# Patient Record
Sex: Male | Born: 1975
Health system: Southern US, Community
[De-identification: ages and names within clinical notes are randomized; demographics above are authoritative.]

## PROBLEM LIST (undated history)

## (undated) DIAGNOSIS — I10 Essential (primary) hypertension: Secondary | ICD-10-CM

## (undated) DIAGNOSIS — G8929 Other chronic pain: Secondary | ICD-10-CM

## (undated) DIAGNOSIS — M79606 Pain in leg, unspecified: Secondary | ICD-10-CM

## (undated) DIAGNOSIS — K029 Dental caries, unspecified: Secondary | ICD-10-CM

---

## 2001-03-09 ENCOUNTER — Emergency Department (HOSPITAL_COMMUNITY): Admission: EM | Admit: 2001-03-09 | Discharge: 2001-03-09 | Payer: Self-pay | Admitting: Emergency Medicine

## 2004-01-09 ENCOUNTER — Emergency Department (HOSPITAL_COMMUNITY): Admission: EM | Admit: 2004-01-09 | Discharge: 2004-01-09 | Payer: Self-pay | Admitting: Emergency Medicine

## 2004-02-03 ENCOUNTER — Emergency Department (HOSPITAL_COMMUNITY): Admission: EM | Admit: 2004-02-03 | Discharge: 2004-02-03 | Payer: Self-pay | Admitting: Emergency Medicine

## 2004-02-17 ENCOUNTER — Emergency Department (HOSPITAL_COMMUNITY): Admission: EM | Admit: 2004-02-17 | Discharge: 2004-02-17 | Payer: Self-pay | Admitting: Emergency Medicine

## 2004-03-19 ENCOUNTER — Emergency Department (HOSPITAL_COMMUNITY): Admission: EM | Admit: 2004-03-19 | Discharge: 2004-03-19 | Payer: Self-pay | Admitting: Emergency Medicine

## 2004-04-20 ENCOUNTER — Emergency Department (HOSPITAL_COMMUNITY): Admission: EM | Admit: 2004-04-20 | Discharge: 2004-04-20 | Payer: Self-pay | Admitting: Emergency Medicine

## 2004-05-20 ENCOUNTER — Emergency Department (HOSPITAL_COMMUNITY): Admission: EM | Admit: 2004-05-20 | Discharge: 2004-05-21 | Payer: Self-pay | Admitting: *Deleted

## 2004-06-03 ENCOUNTER — Emergency Department (HOSPITAL_COMMUNITY): Admission: EM | Admit: 2004-06-03 | Discharge: 2004-06-03 | Payer: Self-pay | Admitting: Emergency Medicine

## 2004-11-04 ENCOUNTER — Emergency Department (HOSPITAL_COMMUNITY): Admission: EM | Admit: 2004-11-04 | Discharge: 2004-11-04 | Payer: Self-pay | Admitting: Emergency Medicine

## 2004-11-30 ENCOUNTER — Emergency Department (HOSPITAL_COMMUNITY): Admission: EM | Admit: 2004-11-30 | Discharge: 2004-11-30 | Payer: Self-pay | Admitting: Emergency Medicine

## 2005-01-03 ENCOUNTER — Emergency Department (HOSPITAL_COMMUNITY): Admission: EM | Admit: 2005-01-03 | Discharge: 2005-01-03 | Payer: Self-pay | Admitting: Emergency Medicine

## 2005-01-19 ENCOUNTER — Ambulatory Visit: Payer: Self-pay | Admitting: Family Medicine

## 2005-01-20 ENCOUNTER — Emergency Department (HOSPITAL_COMMUNITY): Admission: EM | Admit: 2005-01-20 | Discharge: 2005-01-20 | Payer: Self-pay | Admitting: Emergency Medicine

## 2005-01-24 ENCOUNTER — Emergency Department (HOSPITAL_COMMUNITY): Admission: EM | Admit: 2005-01-24 | Discharge: 2005-01-24 | Payer: Self-pay | Admitting: Emergency Medicine

## 2005-05-17 ENCOUNTER — Emergency Department (HOSPITAL_COMMUNITY): Admission: EM | Admit: 2005-05-17 | Discharge: 2005-05-17 | Payer: Self-pay | Admitting: *Deleted

## 2005-06-06 ENCOUNTER — Emergency Department (HOSPITAL_COMMUNITY): Admission: EM | Admit: 2005-06-06 | Discharge: 2005-06-06 | Payer: Self-pay | Admitting: Emergency Medicine

## 2005-07-31 ENCOUNTER — Emergency Department: Payer: Self-pay | Admitting: General Practice

## 2005-08-07 ENCOUNTER — Emergency Department (HOSPITAL_COMMUNITY): Admission: EM | Admit: 2005-08-07 | Discharge: 2005-08-07 | Payer: Self-pay | Admitting: Emergency Medicine

## 2005-10-14 ENCOUNTER — Emergency Department (HOSPITAL_COMMUNITY): Admission: EM | Admit: 2005-10-14 | Discharge: 2005-10-14 | Payer: Self-pay | Admitting: Emergency Medicine

## 2005-12-16 ENCOUNTER — Emergency Department (HOSPITAL_COMMUNITY): Admission: EM | Admit: 2005-12-16 | Discharge: 2005-12-16 | Payer: Self-pay | Admitting: Emergency Medicine

## 2006-01-11 ENCOUNTER — Ambulatory Visit (HOSPITAL_COMMUNITY): Admission: RE | Admit: 2006-01-11 | Discharge: 2006-01-11 | Payer: Self-pay | Admitting: Family Medicine

## 2006-01-12 ENCOUNTER — Ambulatory Visit: Payer: Self-pay | Admitting: Family Medicine

## 2006-02-07 ENCOUNTER — Ambulatory Visit: Payer: Self-pay | Admitting: Family Medicine

## 2006-03-10 ENCOUNTER — Ambulatory Visit: Payer: Self-pay | Admitting: Family Medicine

## 2006-04-13 ENCOUNTER — Emergency Department (HOSPITAL_COMMUNITY): Admission: EM | Admit: 2006-04-13 | Discharge: 2006-04-13 | Payer: Self-pay | Admitting: Emergency Medicine

## 2006-05-15 ENCOUNTER — Ambulatory Visit: Payer: Self-pay | Admitting: Family Medicine

## 2006-11-15 ENCOUNTER — Emergency Department (HOSPITAL_COMMUNITY): Admission: EM | Admit: 2006-11-15 | Discharge: 2006-11-15 | Payer: Self-pay | Admitting: Emergency Medicine

## 2007-08-14 ENCOUNTER — Emergency Department (HOSPITAL_COMMUNITY): Admission: EM | Admit: 2007-08-14 | Discharge: 2007-08-14 | Payer: Self-pay | Admitting: Emergency Medicine

## 2007-11-22 ENCOUNTER — Encounter: Payer: Self-pay | Admitting: Family Medicine

## 2010-12-21 NOTE — Letter (Signed)
Summary: Historic Patient File  Historic Patient File   Imported By: Lind Guest 08/27/2010 16:29:29  _____________________________________________________________________  External Attachment:    Type:   Image     Comment:   External Document

## 2012-07-16 DIAGNOSIS — R079 Chest pain, unspecified: Secondary | ICD-10-CM

## 2013-08-04 ENCOUNTER — Emergency Department (HOSPITAL_COMMUNITY)
Admission: EM | Admit: 2013-08-04 | Discharge: 2013-08-04 | Disposition: A | Discharge status: Home / Self Care | Payer: Medicaid - Out of State | Attending: Emergency Medicine | Admitting: Emergency Medicine

## 2013-08-04 ENCOUNTER — Encounter (HOSPITAL_COMMUNITY): Payer: Self-pay | Admitting: *Deleted

## 2013-08-04 DIAGNOSIS — Z79899 Other long term (current) drug therapy: Secondary | ICD-10-CM | POA: Insufficient documentation

## 2013-08-04 DIAGNOSIS — K089 Disorder of teeth and supporting structures, unspecified: Secondary | ICD-10-CM | POA: Insufficient documentation

## 2013-08-04 DIAGNOSIS — F17200 Nicotine dependence, unspecified, uncomplicated: Secondary | ICD-10-CM | POA: Insufficient documentation

## 2013-08-04 DIAGNOSIS — K029 Dental caries, unspecified: Secondary | ICD-10-CM

## 2013-08-04 MED ORDER — CLINDAMYCIN HCL 150 MG PO CAPS
300.0000 mg | ORAL_CAPSULE | Freq: Once | ORAL | Status: AC
  Administered 2013-08-04: 300 mg via ORAL
  Filled 2013-08-04: qty 2

## 2013-08-04 MED ORDER — OXYCODONE-ACETAMINOPHEN 5-325 MG PO TABS
1.0000 | ORAL_TABLET | Freq: Once | ORAL | Status: AC
  Administered 2013-08-04: 1 via ORAL
  Filled 2013-08-04: qty 1

## 2013-08-04 MED ORDER — NAPROXEN 500 MG PO TABS: 500.0000 mg | ORAL_TABLET | Freq: Two times a day (BID) | ORAL | Status: DC

## 2013-08-04 MED ORDER — CLINDAMYCIN HCL 150 MG PO CAPS: 150.0000 mg | ORAL_CAPSULE | Freq: Four times a day (QID) | ORAL | Status: DC

## 2013-08-04 NOTE — ED Provider Notes (Signed)
CSN: [id number]     Arrival date & time 08/04/13  1332 History   First MD Initiated Contact with Patient 08/04/13 1352     Chief Complaint  Patient presents with  . Dental Pain   (Consider location/radiation/quality/duration/timing/severity/associated sxs/prior Treatment) Patient is a 37 y.o. male presenting with tooth pain. The history is provided by the patient.  Dental Pain Location:  Lower Lower teeth location:  19/LL 1st molar and 18/LL 2nd molar Quality:  Throbbing Severity:  Severe Onset quality:  Gradual Timing:  Constant Progression:  Worsening Chronicity:  New Context: dental caries   Worsened by:  Cold food/drink, jaw movement and pressure Ineffective treatments:  Acetaminophen and NSAIDs Associated symptoms: no congestion, no fever, no headaches, no neck pain, no oral bleeding and no oral lesions    Jeff Nash is a 37 y.o. male who presents to the ED with dental pain. He states the pain has been there for "a while" but this morning got really bad. The pain is located in the lower right dental area. The pain is severe. The pain radiates to the right ear.  History reviewed. No pertinent past medical history. History reviewed. No pertinent past surgical history. No family history on file. History  Substance Use Topics  . Smoking status: Current Every Day Smoker    Types: Cigarettes  . Smokeless tobacco: Not on file  . Alcohol Use: Yes     Comment: Occ    Review of Systems  Constitutional: Negative for fever and chills.  HENT: Positive for dental problem. Negative for congestion, mouth sores, neck pain and tinnitus.   Gastrointestinal: Negative for nausea and vomiting.  Musculoskeletal: Negative for myalgias.  Skin: Negative for rash.  Allergic/Immunologic: Negative for immunocompromised state.  Neurological: Negative for headaches.  Psychiatric/Behavioral: The patient is not nervous/anxious.     Allergies  Review of patient's allergies indicates no known  allergies.  Home Medications   Current Outpatient Rx  Name  Route  Sig  Dispense  Refill  . clindamycin (CLEOCIN) 150 MG capsule   Oral   Take 1 capsule (150 mg total) by mouth every 6 (six) hours.   28 capsule   0   . naproxen (NAPROSYN) 500 MG tablet   Oral   Take 1 tablet (500 mg total) by mouth 2 (two) times daily.   20 tablet   0    BP 157/96  Pulse 95  Temp(Src) 98 F (36.7 C) (Oral)  Resp 16  SpO2 100% Physical Exam  Nursing note and vitals reviewed. Constitutional: He is oriented to person, place, and time. He appears well-developed and well-nourished. No distress.  HENT:  Head: Normocephalic.  Right Ear: Tympanic membrane normal.  Left Ear: Tympanic membrane normal.  Nose: Nose normal.  Mouth/Throat: Uvula is midline, oropharynx is clear and moist and mucous membranes are normal. Dental caries present.    Pain and caries right lower first and second molars.  Eyes: EOM are normal.  Neck: Neck supple.  Cardiovascular: Normal rate and regular rhythm.   Pulmonary/Chest: Effort normal and breath sounds normal.  Abdominal: Soft. There is no tenderness.  Musculoskeletal: Normal range of motion.  Neurological: He is alert and oriented to person, place, and time. No cranial nerve deficit.  Skin: Skin is warm and dry.  Psychiatric: He has a normal mood and affect. His behavior is normal.    ED Course  Procedures  MDM   1. Pain due to dental caries    37 y.o. male  with dental pain due to caries. Will treat with antibiotics and NSAIDS. He is to keep his follow up appointment with the dentist.  Discussed with the patient and all questioned fully answered.   Medication List         clindamycin 150 MG capsule  Commonly known as:  CLEOCIN  Take 1 capsule (150 mg total) by mouth every 6 (six) hours.     naproxen 500 MG tablet  Commonly known as:  NAPROSYN  Take 1 tablet (500 mg total) by mouth 2 (two) times daily.           Janne Napoleon, [place] 08/04/13  1415

## 2013-08-04 NOTE — ED Provider Notes (Signed)
Medical screening examination/treatment/procedure(s) were performed by non-physician practitioner and as supervising physician I was immediately available for consultation/collaboration.  Devyon Keator, MD 08/04/13 1520 

## 2013-08-04 NOTE — ED Notes (Signed)
Dental pain since last night. Pain to right bottom molar, per pt. Appt to see dentist in 2 mo.

## 2013-08-21 ENCOUNTER — Encounter (HOSPITAL_COMMUNITY): Payer: Self-pay

## 2013-08-21 ENCOUNTER — Emergency Department (HOSPITAL_COMMUNITY)
Admission: EM | Admit: 2013-08-21 | Discharge: 2013-08-21 | Disposition: A | Discharge status: Home / Self Care | Payer: Medicaid - Out of State | Attending: Emergency Medicine | Admitting: Emergency Medicine

## 2013-08-21 DIAGNOSIS — M545 Low back pain, unspecified: Secondary | ICD-10-CM | POA: Insufficient documentation

## 2013-08-21 DIAGNOSIS — Z87828 Personal history of other (healed) physical injury and trauma: Secondary | ICD-10-CM | POA: Insufficient documentation

## 2013-08-21 DIAGNOSIS — F17200 Nicotine dependence, unspecified, uncomplicated: Secondary | ICD-10-CM | POA: Insufficient documentation

## 2013-08-21 DIAGNOSIS — R0789 Other chest pain: Secondary | ICD-10-CM | POA: Insufficient documentation

## 2013-08-21 MED ORDER — IBUPROFEN 600 MG PO TABS: 600.0000 mg | ORAL_TABLET | Freq: Three times a day (TID) | ORAL | Status: DC | PRN

## 2013-08-21 MED ORDER — KETOROLAC TROMETHAMINE 60 MG/2ML IM SOLN
60.0000 mg | Freq: Once | INTRAMUSCULAR | Status: AC
  Administered 2013-08-21: 60 mg via INTRAMUSCULAR
  Filled 2013-08-21: qty 2

## 2013-08-21 NOTE — ED Notes (Signed)
1. Pt reports back pain since [place] [employer]. 2. Chest "tight" since last night", sob at times, no fever, no nausea or vomiting, no cough or congestion. Was outside smoking when called for triage.

## 2013-08-21 NOTE — ED Provider Notes (Signed)
CSN: [id number]     Arrival date & time 08/21/13  1252 History   First MD Initiated Contact with Patient 08/21/13 1350     Chief Complaint  Patient presents with  . Back Pain  . Chest Pain    HPI Patient reports ongoing right lower back pain since a fall last week.  He's been seen in outside hospital and had imaging of his right hip in his L-spine done.  He states the demonstrated no evidence of fracture.  He's been home on pain medication he reports that he still is having pain at this time.  He denies weakness of his arms or legs.  Head injury.  No neck pain.  He denies history of IV drug abuse.  No fevers or chills.  No abdominal pain.  He does report some occasional chest tightness that last for a few seconds and resolves.  He has no chest pain at this time.  No shortness of breath at this time.  He denies a history of cardiac disease.  No family history of early cardiac disease.  He denies cough or congestion or upper respiratory symptoms.  No rash.   History reviewed. No pertinent past medical history. History reviewed. No pertinent past surgical history. No family history on file. History  Substance Use Topics  . Smoking status: Current Every Day Smoker    Types: Cigarettes  . Smokeless tobacco: Not on file  . Alcohol Use: Yes     Comment: Occ    Review of Systems  All other systems reviewed and are negative.    Allergies  Review of patient's allergies indicates no known allergies.  Home Medications   Current Outpatient Rx  Name  Route  Sig  Dispense  Refill  . ibuprofen (ADVIL,MOTRIN) 600 MG tablet   Oral   Take 1 tablet (600 mg total) by mouth every 8 (eight) hours as needed for pain.   15 tablet   0    BP 122/78  Pulse 81  Temp(Src) 97.9 F (36.6 C) (Oral)  Resp 20  Ht 5\' 9"  (1.753 m)  Wt 170 lb (77.111 kg)  BMI 25.09 kg/m2  SpO2 100% Physical Exam  Nursing note and vitals reviewed. Constitutional: He is oriented to person, place, and time. He appears  well-developed and well-nourished.  HENT:  Head: Normocephalic and atraumatic.  Eyes: EOM are normal.  Neck: Normal range of motion.  Cardiovascular: Normal rate, regular rhythm, normal heart sounds and intact distal pulses.   Pulmonary/Chest: Effort normal and breath sounds normal. No respiratory distress.  Abdominal: Soft. He exhibits no distension. There is no tenderness.  Musculoskeletal: Normal range of motion.  Neurological: He is alert and oriented to person, place, and time.  Skin: Skin is warm and dry.  Psychiatric: He has a normal mood and affect. Judgment normal.    ED Course  Procedures (including critical care time) Labs Review  ECG interpretation   Date: 08/21/2013  Rate: 76  Rhythm: normal sinus rhythm  QRS Axis: normal  Intervals: normal  ST/T Wave abnormalities: normal  Conduction Disutrbances: none  Narrative Interpretation:   Old EKG Reviewed: No prior KG available     Labs Reviewed - No data to display Imaging Review No results found.  MDM   1. Low back pain    Normal lower extremity neurologic exam. No bowel or bladder complaints. No back pain red flags. Likely musculoskeletal back pain. Doubt spinal epidural abscess. Doubt cauda equina. Doubt abdominal aortic aneurysm.  Chest  pain sounds musculoskeletal versus GERD.  This is not ACS     Lyanne Co, MD 08/21/13 [phone]

## 2013-11-04 ENCOUNTER — Encounter (HOSPITAL_COMMUNITY): Payer: Self-pay | Admitting: Emergency Medicine

## 2013-11-04 ENCOUNTER — Emergency Department (HOSPITAL_COMMUNITY): Payer: Medicaid - Out of State

## 2013-11-04 ENCOUNTER — Emergency Department (HOSPITAL_COMMUNITY)
Admission: EM | Admit: 2013-11-04 | Discharge: 2013-11-04 | Disposition: A | Discharge status: Home / Self Care | Payer: Medicaid - Out of State | Attending: Emergency Medicine | Admitting: Emergency Medicine

## 2013-11-04 DIAGNOSIS — Z79899 Other long term (current) drug therapy: Secondary | ICD-10-CM | POA: Insufficient documentation

## 2013-11-04 DIAGNOSIS — I10 Essential (primary) hypertension: Secondary | ICD-10-CM | POA: Insufficient documentation

## 2013-11-04 DIAGNOSIS — R197 Diarrhea, unspecified: Secondary | ICD-10-CM | POA: Insufficient documentation

## 2013-11-04 DIAGNOSIS — F17200 Nicotine dependence, unspecified, uncomplicated: Secondary | ICD-10-CM | POA: Insufficient documentation

## 2013-11-04 DIAGNOSIS — J209 Acute bronchitis, unspecified: Secondary | ICD-10-CM

## 2013-11-04 HISTORY — DX: Essential (primary) hypertension: I10

## 2013-11-04 MED ORDER — BENZONATATE 100 MG PO CAPS: 100.0000 mg | ORAL_CAPSULE | Freq: Three times a day (TID) | ORAL | Status: DC

## 2013-11-04 MED ORDER — PROMETHAZINE-CODEINE 6.25-10 MG/5ML PO SYRP: 5.0000 mL | ORAL_SOLUTION | ORAL | Status: DC | PRN

## 2013-11-04 MED ORDER — PROMETHAZINE-CODEINE 6.25-10 MG/5ML PO SYRP
5.0000 mL | ORAL_SOLUTION | Freq: Once | ORAL | Status: AC
  Administered 2013-11-04: 5 mL via ORAL
  Filled 2013-11-04: qty 5

## 2013-11-04 MED ORDER — ALBUTEROL SULFATE HFA 108 (90 BASE) MCG/ACT IN AERS: 1.0000 | INHALATION_SPRAY | Freq: Four times a day (QID) | RESPIRATORY_TRACT | Status: AC | PRN

## 2013-11-04 MED ORDER — BENZONATATE 100 MG PO CAPS
200.0000 mg | ORAL_CAPSULE | Freq: Once | ORAL | Status: AC
  Administered 2013-11-04: 200 mg via ORAL
  Filled 2013-11-04: qty 2

## 2013-11-04 MED ORDER — DIPHENOXYLATE-ATROPINE 2.5-0.025 MG PO TABS
2.0000 | ORAL_TABLET | Freq: Once | ORAL | Status: AC
  Administered 2013-11-04: 2 via ORAL
  Filled 2013-11-04: qty 2

## 2013-11-04 NOTE — ED Notes (Signed)
Pt with nonproductive cough for 1 1/2 weeks per pt, + diarrhea, denies N/V,  admits to taking mucinex without relief

## 2013-11-04 NOTE — ED Provider Notes (Signed)
CSN: [id number]     Arrival date & time 11/04/13  1053 History   First MD Initiated Contact with Patient 11/04/13 1157     Chief Complaint  Patient presents with  . Cough   (Consider location/radiation/quality/duration/timing/severity/associated sxs/prior Treatment) HPI Comments: Jeff Nash is a 37 y.o. Male presenting with a 1.5 week history of intermittent episodes of cough associated with white sputum production and occasional episodes of small amount of fecal incontinence with coughing spells.  He reports diarrhea which has been brown, soft without being watery occuring 1-2 times daily for the past several days.  He denies nausea, vomiting, abdominal pain, but has had some mild lower abdominal cramping prior to bms.  He has had no bloody stools.  Also denies shortness of breath, chest pain, fevers, nasal congestion and sore throat.  He is a daily smoker 1ppd.   The history is provided by the patient and the spouse.    Past Medical History  Diagnosis Date  . Hypertension    History reviewed. No pertinent past surgical history. History reviewed. No pertinent family history. History  Substance Use Topics  . Smoking status: Current Every Day Smoker    Types: Cigarettes  . Smokeless tobacco: Not on file  . Alcohol Use: Yes     Comment: Occ    Review of Systems  Constitutional: Negative for fever and chills.  HENT: Negative for congestion and sore throat.   Eyes: Negative.   Respiratory: Positive for cough. Negative for chest tightness and shortness of breath.   Cardiovascular: Negative for chest pain and palpitations.  Gastrointestinal: Positive for diarrhea. Negative for nausea, vomiting, abdominal pain and blood in stool.  Genitourinary: Negative.   Musculoskeletal: Negative for arthralgias, joint swelling and neck pain.  Skin: Negative.  Negative for rash and wound.  Neurological: Negative for dizziness, weakness, light-headedness, numbness and headaches.   Psychiatric/Behavioral: Negative.     Allergies  Review of patient's allergies indicates no known allergies.  Home Medications   Current Outpatient Rx  Name  Route  Sig  Dispense  Refill  . gabapentin (NEURONTIN) 600 MG tablet   Oral   Take 600 mg by mouth 3 (three) times daily.         Marland Kitchen lisinopril (PRINIVIL,ZESTRIL) 10 MG tablet   Oral   Take 10 mg by mouth daily.         Marland Kitchen albuterol (PROVENTIL HFA;VENTOLIN HFA) 108 (90 BASE) MCG/ACT inhaler   Inhalation   Inhale 1-2 puffs into the lungs every 6 (six) hours as needed for wheezing or shortness of breath.   1 Inhaler   0   . benzonatate (TESSALON) 100 MG capsule   Oral   Take 1 capsule (100 mg total) by mouth every 8 (eight) hours.   21 capsule   0   . promethazine-codeine (PHENERGAN WITH CODEINE) 6.25-10 MG/5ML syrup   Oral   Take 5 mLs by mouth every 4 (four) hours as needed for cough.   120 mL   0    BP 157/96  Pulse 85  Temp(Src) 98 F (36.7 C) (Oral)  Resp 24  Ht 5\' 9"  (1.753 m)  Wt 170 lb (77.111 kg)  BMI 25.09 kg/m2  SpO2 98% Physical Exam  Nursing note and vitals reviewed. Constitutional: He appears well-developed and well-nourished.  HENT:  Head: Normocephalic and atraumatic.  Eyes: Conjunctivae are normal.  Neck: Normal range of motion.  Cardiovascular: Normal rate, regular rhythm, normal heart sounds and intact distal pulses.  Pulmonary/Chest: Effort normal. He has no decreased breath sounds. He has wheezes. He has no rhonchi. He has no rales.  Occasional scattered expiratory wheeze, prolonged expiration phase.   Abdominal: Soft. Bowel sounds are normal. There is no tenderness.  Musculoskeletal: Normal range of motion.  Neurological: He is alert.  Skin: Skin is warm and dry.  Psychiatric: He has a normal mood and affect.    ED Course  Procedures (including critical care time) Labs Review Labs Reviewed - No data to display Imaging Review Dg Chest 2 View  11/04/2013   CLINICAL  DATA:  Cough, diarrhea  EXAM: CHEST  2 VIEW  COMPARISON:  07/16/2012  FINDINGS: Cardiomediastinal silhouette is stable. No acute infiltrate or pleural effusion. No pulmonary edema. Bony thorax is unremarkable.  IMPRESSION: No active cardiopulmonary disease.   Electronically Signed   By: Natasha Mead M.D.   On: 11/04/2013 11:57    EKG Interpretation   None       MDM   1. Bronchitis, acute, with bronchospasm    Pt prescribed albuterol, tessalon and phenegan with codeine,  Encouraged rest, fluids,  Smoking cessation.  He was given a dose of lomotil to firm stools, encouraged recheck by pcp if sx not improving over the next several days.    Discussed xray results with with patient. Patients labs and/or radiological studies were viewed and considered during the medical decision making and disposition process.     Burgess Amor, PA-C 11/06/13 1236  Burgess Amor, PA-C 11/06/13 1236

## 2013-11-08 NOTE — ED Provider Notes (Signed)
Medical screening examination/treatment/procedure(s) were performed by non-physician practitioner and as supervising physician I was immediately available for consultation/collaboration.  EKG Interpretation   None        Evens Meno, MD 11/08/13 1826 

## 2014-01-12 ENCOUNTER — Emergency Department (HOSPITAL_COMMUNITY)
Admission: EM | Admit: 2014-01-12 | Discharge: 2014-01-12 | Disposition: A | Discharge status: Home / Self Care | Payer: Medicaid - Out of State | Attending: Emergency Medicine | Admitting: Emergency Medicine

## 2014-01-12 ENCOUNTER — Encounter (HOSPITAL_COMMUNITY): Payer: Self-pay | Admitting: Emergency Medicine

## 2014-01-12 DIAGNOSIS — K0889 Other specified disorders of teeth and supporting structures: Secondary | ICD-10-CM

## 2014-01-12 DIAGNOSIS — Z79899 Other long term (current) drug therapy: Secondary | ICD-10-CM | POA: Insufficient documentation

## 2014-01-12 DIAGNOSIS — F17200 Nicotine dependence, unspecified, uncomplicated: Secondary | ICD-10-CM | POA: Insufficient documentation

## 2014-01-12 DIAGNOSIS — K0381 Cracked tooth: Secondary | ICD-10-CM | POA: Insufficient documentation

## 2014-01-12 DIAGNOSIS — K089 Disorder of teeth and supporting structures, unspecified: Secondary | ICD-10-CM | POA: Insufficient documentation

## 2014-01-12 DIAGNOSIS — I10 Essential (primary) hypertension: Secondary | ICD-10-CM | POA: Insufficient documentation

## 2014-01-12 DIAGNOSIS — K029 Dental caries, unspecified: Secondary | ICD-10-CM | POA: Insufficient documentation

## 2014-01-12 MED ORDER — TRAMADOL HCL 50 MG PO TABS: 50.0000 mg | ORAL_TABLET | Freq: Four times a day (QID) | ORAL | Status: DC | PRN

## 2014-01-12 MED ORDER — AMOXICILLIN 500 MG PO CAPS: 500.0000 mg | ORAL_CAPSULE | Freq: Three times a day (TID) | ORAL | Status: DC

## 2014-01-12 NOTE — Discharge Instructions (Signed)
Dental Pain °Toothache is pain in or around a tooth. It may get worse with chewing or with cold or heat.  °HOME CARE °· Your dentist may use a numbing medicine during treatment. If so, you may need to avoid eating until the medicine wears off. Ask your dentist about this. °· Only take medicine as told by your dentist or doctor. °· Avoid chewing food near the painful tooth until after all treatment is done. Ask your dentist about this. °GET HELP RIGHT AWAY IF:  °· The problem gets worse or new problems appear. °· You have a fever. °· There is redness and puffiness (swelling) of the face, jaw, or neck. °· You cannot open your mouth. °· There is pain in the jaw. °· There is very bad pain that is not helped by medicine. °MAKE SURE YOU:  °· Understand these instructions. °· Will watch your condition. °· Will get help right away if you are not doing well or get worse. °Document Released: 04/25/2008 Document Revised: 01/30/2012 Document Reviewed: 04/25/2008 °ExitCare® Patient Information ©2014 [employer]. ° ° °Emergency Department Resource Guide °1) Find a Doctor and Pay Out of Pocket °Although you won't have to find out who is covered by your insurance plan, it is a good idea to ask around and get recommendations. You will then need to call the office and see if the doctor you have chosen will accept you as a new patient and what types of options they offer for patients who are self-pay. Some doctors offer discounts or will set up payment plans for their patients who do not have insurance, but you will need to ask so you aren't surprised when you get to your appointment. ° °2) Contact Your Local Health Department °Not all health departments have doctors that can see patients for sick visits, but many do, so it is worth a call to see if yours does. If you don't know where your local health department is, you can check in your phone book. The CDC also has a tool to help you locate your state's health department, and many  state websites also have listings of all of their local health departments. ° °3) Find a Walk-in Clinic °If your illness is not likely to be very severe or complicated, you may want to try a walk in clinic. These are popping up all over the country in pharmacies, drugstores, and shopping centers. They're usually staffed by nurse practitioners or physician assistants that have been trained to treat common illnesses and complaints. They're usually fairly quick and inexpensive. However, if you have serious medical issues or chronic medical problems, these are probably not your best option. ° °No Primary Care Doctor: °- Call [facility] at  832-8000 - they can help you locate a primary care doctor that  accepts your insurance, provides certain services, etc. °- Physician Referral Service- 1-800-533-3463 ° °Chronic Pain Problems: °Organization         Address  Phone   Notes  °[facility]  (336) 297-2271 Patients need to be referred by their primary care doctor.  ° °Medication Assistance: °Organization         Address  Phone   Notes  °[place] [facility] [address] °[place], [place] [address] (336) 641-8030 --Must be a resident of [place] °-- Must have NO insurance coverage whatsoever (no Medicaid/ Medicare, etc.) °-- The pt. MUST have a primary care doctor that directs their care regularly and follows   them in the community °  °[facility]  (866) 331-1348   °[employer]  (888) 892-1162   ° °Agencies that provide inexpensive medical care: °Organization         Address  Phone   Notes  °[facility]  (336) 832-8035   °[facility]    (336) 832-7272   °[facility] [address] °[place], [place] [address] (336) 832-4777   °[facility] [address], °[place] (336) 271-4999   °[facility]    (336) 373-0678   °[facility]    (336) 272-1050   °[facility] ° [address], [place] Phone:  (336) 832-4444, Fax:  (336) 832-4440 Hours of Operation:  9 am - 6 pm, M-F.  Also accepts Medicaid/Medicare and self-pay.  °[facility] ° [address], [place] Phone: (336) 832-3150, Fax: (336) 832-3151. Hours of Operation:  8:30 am - 5:30 pm, M-F.  Also accepts Medicaid and self-pay.  °[facility] [place] [address], [place] Phone: (336) 878-6027   °[facility] [address], [place], [place] (336)723-1848, Ext. 123 Mondays & Thursdays: 7-9 AM.  First 15 patients are seen on a first come, first serve basis. °  ° °Medicaid-accepting [place] Providers: ° °Organization         Address  Phone   Notes  °[facility] [address], [place] (336) 641-2100 Also accepts self-pay patients.  °[facility] [address], [place] ° (336) 856-9996   °[facility] [address], [place] (336) 288-8857   °[facility] [address] [place] [address], [place] (336) 299-7000   °Veita Bland [address], [place]  ° (336) 373-1557 Only accepts [place] Access Medicaid patients after they have their name applied to their card.  ° °Self-Pay (no insurance) in [place]: ° °Organization         Address  Phone   Notes  °Sickle Cell Patients, [facility] [address], [place] (336) 832-1970   °[facility] [address], [place] (336) 832-4400   °[facility] ° [address], [place] (336) 992-4800   °[facility]/Dr. Osei-Bonsu ° [address] [place] [address], [place] or [address], [place] (336) 841-8500 Phone number for both [place] and [place] locations is the same.  °[facility] [address], [place] (336) 299-0000   °[facility] [address]  [place] [address], [place] or [address] (336) 852-7530 °(336) 878-2260   °[facility] [address], [place] (336) 350-1642, phone; (336) 294-5005, fax Sees patients 1st and 3rd Saturday of every month.  Must not qualify for public or private insurance (i.e. Medicaid, Medicare, [place] Health Choice, Veterans' Benefits) • Household income should be no more than 200% of the poverty level •The clinic cannot treat you if you are pregnant or think you are pregnant • Sexually transmitted diseases are not treated at the clinic.  ° ° °Dental Care: °Organization         Address  Phone  Notes  °[facility] [address],  (336) 641-6152 Accepts children up to age 21 who are enrolled   in Medicaid or [place] Health Choice; pregnant women with a Medicaid card; and children who have applied for Medicaid or [place] Health Choice, but were declined, whose parents can pay a reduced fee at time of service.  °[facility] [place]  [address], [place] (336) 641-7733 Accepts children up to age 21 who are enrolled in Medicaid or [place] Health Choice; pregnant women with a Medicaid card; and children who have applied for Medicaid or [place] Health Choice, but were declined, whose parents can pay a reduced fee at time of service.  °[facility] ° [address], [place] (336) 641-4533 Patients are seen by appointment only. Walk-ins are not accepted. [facility] will see patients 18 years of age and older. °Monday - Tuesday (8am-5pm) °Most Wednesdays (8:30-5pm) °$30 per visit, cash only  °[facility] ° [address], [place] (336) 641-4533 Patients are seen by appointment only. Walk-ins are not accepted. [facility] will see patients 18 years of age and older. °One Wednesday Evening (Monthly: Volunteer Based).  $30 per visit, cash only  °[facility]  (919) 537-3737 for adults; Children under age 4, call Graduate Pediatric Dentistry at (919) [phone]. Children aged 4-14, please call (919) [phone] to request a pediatric application. ° Dental services are provided in all areas of dental care including fillings, crowns and bridges, complete and partial dentures, implants, gum treatment, root canals, and extractions. Preventive care is also provided. Treatment is provided to both adults and children. °Patients are selected via a lottery and there is often a waiting list. °  °[facility] [address], °[place] ° (336) 763-8833 [email or web address] °  °[facility] [address], [place], [place] (336)723-1848, Ext. 123 Second and Fourth Thursday of each month, opens at 6:30 AM; Clinic ends at 9 AM.  Patients are seen on a first-come first-served basis, and a limited number are seen during each clinic.  ° °[facility] ° [address], [place], [place] (336) 723-7904   Eligibility Requirements °You must have lived in [place], [place], or [place] counties for at least the last three months. °  You cannot be eligible for state or federal sponsored healthcare insurance, including Veterans Administration, Medicaid, or Medicare. °  You generally cannot be eligible for healthcare insurance through your employer.  °  How to apply: °Eligibility screenings are held every Tuesday and Wednesday afternoon from 1:00 pm until 4:00 pm. You do not need an appointment for the interview!  °[facility] [address], [place], [place] 336-631-2330   °[facility]  336-342-8273   °[facility]  336-703-3100   °[facility]  336-570-6415   ° °Behavioral Health Resources in the Community: °Intensive Outpatient Programs °Organization         Address  Phone  Notes  °[facility] [address], [place], [place]  336-878-6098   °[facility] [address], [place], [place] 336-832-9800   °[facility] [address], [place], [place] ° 336-882-2125   °[facility] [address],  °[place], [place] 1-800-853-5163 or 336-641-4981   °Substance Abuse Resources °Organization         Address  Phone  Notes  °[facility]  336-882-2125   °[facility]  336-784-9470   °  [facility]  336-285-9073   °[facility]  336-845-3988   °Residential & Outpatient Substance Abuse Program  1-800-659-3381   °Psychological Services °Organization         Address  Phone  Notes  °[facility]  336- 832-9600   °[facility]  336- 378-7881   °[facility] [address], [place] 1-800-853-5163 or 336-641-4981   ° °Mobile Crisis Teams °Organization         Address  Phone  Notes  °[facility], Mobile Crisis Care Unit  1-877-626-1772   °Assertive °[facility] ° [address] [place], [place] 336-834-9664   °Sharon DeEsch [address] °[place] 336-554-5454   ° °Self-Help/Support Groups °Organization         Address  Phone             Notes  °[facility] - variety of support groups  336- 373-1402 Call for more information  °Narcotics Anonymous (NA), [facility] [address], °[place]  2 meetings at this location  ° °Residential Treatment Programs °Organization         Address  Phone  Notes  °[facility] [address],    °[place]  1-866-801-8205   °[facility] ° [address], [place], [place] 704-293-8524   °[facility] [address], [place] 336-845-3988 Admissions: 8am-3pm M-F  °[facility] [address]    °[place], [place] 336-841-1104   °[facility] [address], [place], [place] 336-379-7146   °[facility] [address]    °[place], [place] 336-285-9073   °[facility] Intensive Outpatient [address], [place], [place] 336-852-3033   °[facility] [address]  °[place], [place] 1-877-615-2722 or 336-784-9470   °[facility] [address] [place], [place] 336-227-7417 Accepts Medicaid  °[facility] [address]  °[place] 1-800-659-3381 Substance Abuse/Addiction Treatment  ° °[place] Behavioral Health Resources °Organization         Address  Phone  Notes  °[facility]  (888) 581-9988   °Julie Brannon, PhD [address] [place], [place]   (336) 349-5553 or (336) 951-0000   °[facility]   [address] °[place], [place] (336) 349-4454   °[facility] [address], [place], [place] (336) 342-8316 Insurance/Medicaid/sponsorship through [facility]  °[facility] [address]                                    [place], [place] (336) 342-8316 Therapy/tele-psych/case  °[facility] [address]  ° [place], [place] (336) 349-2233    °Dr. Arfeen  (336) 349-4544   °[facility] [place]  [employer] [place] [facility] 1) [address], [place] °2) [address], [place] °3)  [address], [place] (336) 349-3220 °(336) 342-7768 ° °(336) 342-8140   °[place] [facility] (336) 342-1394 or (336) 342-3537 (After Hours)    ° ° ° °

## 2014-01-12 NOTE — ED Notes (Signed)
Pt reports broke a tooth last night, c/o pain.

## 2014-01-12 NOTE — ED Provider Notes (Signed)
CSN: [id number]     Arrival date & time 01/12/14  1134 History   This chart was scribed for Pauline Aus, PA-C by Ladona Ridgel Day, ED scribe. This patient was seen in room APFT23/APFT23 and the patient's care was started at 1134.  Chief Complaint  Patient presents with  . Dental Pain   The history is provided by the patient. No language interpreter was used.   HPI Comments: Jeff Nash is a 38 y.o. male who presents to the Emergency Department complaining of constant, moderate left lower tooth pain, onset last PM which he attributes to fractured tooth that occurred while eating. He tried taking gabapentin and tramadol for this problem w/minimal relief from pain. Nothing makes the pain better or worse.  He does not have a dentist.   He has no allergies to medicines  Past Medical History  Diagnosis Date  . Hypertension    History reviewed. No pertinent past surgical history. No family history on file. History  Substance Use Topics  . Smoking status: Current Every Day Smoker    Types: Cigarettes  . Smokeless tobacco: Not on file  . Alcohol Use: Yes     Comment: Occ    Review of Systems  Constitutional: Negative for fever and chills.  HENT: Positive for dental problem.   Respiratory: Negative for cough and shortness of breath.   Cardiovascular: Negative for chest pain.  Gastrointestinal: Negative for abdominal pain.  Musculoskeletal: Negative for back pain.  All other systems reviewed and are negative.    Allergies  Review of patient's allergies indicates no known allergies.  Home Medications   Current Outpatient Rx  Name  Route  Sig  Dispense  Refill  . albuterol (PROVENTIL HFA;VENTOLIN HFA) 108 (90 BASE) MCG/ACT inhaler   Inhalation   Inhale 1-2 puffs into the lungs every 6 (six) hours as needed for wheezing or shortness of breath.   1 Inhaler   0   . benzonatate (TESSALON) 100 MG capsule   Oral   Take 1 capsule (100 mg total) by mouth every 8 (eight) hours.   21  capsule   0   . gabapentin (NEURONTIN) 600 MG tablet   Oral   Take 600 mg by mouth 3 (three) times daily.         Marland Kitchen lisinopril (PRINIVIL,ZESTRIL) 10 MG tablet   Oral   Take 10 mg by mouth daily.         . promethazine-codeine (PHENERGAN WITH CODEINE) 6.25-10 MG/5ML syrup   Oral   Take 5 mLs by mouth every 4 (four) hours as needed for cough.   120 mL   0    Triage Vitals: BP 148/89  Pulse 79  Temp(Src) 98.4 F (36.9 C)  Resp 20  Ht 5\' 9"  (1.753 m)  Wt 170 lb (77.111 kg)  BMI 25.09 kg/m2  SpO2 100%  Physical Exam  Nursing note and vitals reviewed. Constitutional: He is oriented to person, place, and time. He appears well-developed and well-nourished. No distress.  HENT:  Head: Normocephalic and atraumatic.  Right Ear: Tympanic membrane and ear canal normal.  Left Ear: Tympanic membrane and ear canal normal.  Mouth/Throat: Uvula is midline, oropharynx is clear and moist and mucous membranes are normal. No trismus in the jaw. Dental caries present. No uvula swelling.  Dental caries and partial avulsion of the #21 tooth. No dental abscess seen. No trismus   Eyes: Right eye exhibits no discharge. Left eye exhibits no discharge.  Neck: Normal range  of motion.  Cardiovascular: Normal rate, regular rhythm and normal heart sounds.   No murmur heard. Pulmonary/Chest: Effort normal and breath sounds normal. No respiratory distress. He has no wheezes. He has no rales.  Musculoskeletal: Normal range of motion. He exhibits no edema.  Lymphadenopathy:    He has no cervical adenopathy.  Neurological: He is alert and oriented to person, place, and time.  Skin: Skin is warm and dry.  Psychiatric: He has a normal mood and affect. Thought content normal.   ED Course  Procedures (including critical care time) DIAGNOSTIC STUDIES: Oxygen Saturation is 100% on room air, normal by my interpretation.    COORDINATION OF CARE: At 1235 PM Discussed treatment plan with patient which  includes antibiotics, pain medicine. Patient agrees.   Labs Review Labs Reviewed - No data to display Imaging Review No results found.  EKG Interpretation   None      MDM   Final diagnoses:  Pain, dental    No concerning sx's for infection to floor of the mouth.  Pt is well appearing.  VSS.    The patient appears reasonably screened and/or stabilized for discharge and I doubt any other medical condition or other [facility] requiring further screening, evaluation, or treatment in the ED at this time prior to discharge.   I personally performed the services described in this documentation, which was scribed in my presence. The recorded information has been reviewed and is accurate.     Carlee Vonderhaar L. Trisha Mangleriplett, PA-C 01/13/14 2224

## 2014-01-14 NOTE — ED Provider Notes (Signed)
Medical screening examination/treatment/procedure(s) were performed by non-physician practitioner and as supervising physician I was immediately available for consultation/collaboration.  EKG Interpretation   None         Jaevon Paras T Dayten Juba, MD 01/14/14 0050 

## 2014-01-27 ENCOUNTER — Emergency Department (HOSPITAL_COMMUNITY): Payer: Medicaid - Out of State

## 2014-01-27 ENCOUNTER — Encounter (HOSPITAL_COMMUNITY): Payer: Self-pay | Admitting: Emergency Medicine

## 2014-01-27 ENCOUNTER — Emergency Department (HOSPITAL_COMMUNITY)
Admission: EM | Admit: 2014-01-27 | Discharge: 2014-01-27 | Disposition: A | Discharge status: Home / Self Care | Payer: Medicaid - Out of State | Attending: Emergency Medicine | Admitting: Emergency Medicine

## 2014-01-27 DIAGNOSIS — Y9389 Activity, other specified: Secondary | ICD-10-CM | POA: Insufficient documentation

## 2014-01-27 DIAGNOSIS — S20219A Contusion of unspecified front wall of thorax, initial encounter: Secondary | ICD-10-CM

## 2014-01-27 DIAGNOSIS — Z79899 Other long term (current) drug therapy: Secondary | ICD-10-CM | POA: Insufficient documentation

## 2014-01-27 DIAGNOSIS — W11XXXA Fall on and from ladder, initial encounter: Secondary | ICD-10-CM | POA: Insufficient documentation

## 2014-01-27 DIAGNOSIS — S39012A Strain of muscle, fascia and tendon of lower back, initial encounter: Secondary | ICD-10-CM

## 2014-01-27 DIAGNOSIS — I10 Essential (primary) hypertension: Secondary | ICD-10-CM | POA: Insufficient documentation

## 2014-01-27 DIAGNOSIS — F17200 Nicotine dependence, unspecified, uncomplicated: Secondary | ICD-10-CM | POA: Insufficient documentation

## 2014-01-27 DIAGNOSIS — Y929 Unspecified place or not applicable: Secondary | ICD-10-CM | POA: Insufficient documentation

## 2014-01-27 DIAGNOSIS — S335XXA Sprain of ligaments of lumbar spine, initial encounter: Secondary | ICD-10-CM | POA: Insufficient documentation

## 2014-01-27 MED ORDER — HYDROCODONE-ACETAMINOPHEN 5-325 MG PO TABS: 1.0000 | ORAL_TABLET | Freq: Four times a day (QID) | ORAL | Status: DC | PRN

## 2014-01-27 MED ORDER — METHOCARBAMOL 500 MG PO TABS: 500.0000 mg | ORAL_TABLET | Freq: Three times a day (TID) | ORAL | Status: DC

## 2014-01-27 MED ORDER — MORPHINE SULFATE 4 MG/ML IJ SOLN
8.0000 mg | Freq: Once | INTRAMUSCULAR | Status: AC
  Administered 2014-01-27: 8 mg via INTRAMUSCULAR
  Filled 2014-01-27: qty 2

## 2014-01-27 MED ORDER — DICLOFENAC SODIUM 75 MG PO TBEC: 75.0000 mg | DELAYED_RELEASE_TABLET | Freq: Two times a day (BID) | ORAL | Status: DC

## 2014-01-27 MED ORDER — DIAZEPAM 5 MG PO TABS
5.0000 mg | ORAL_TABLET | Freq: Once | ORAL | Status: AC
  Administered 2014-01-27: 5 mg via ORAL
  Filled 2014-01-27: qty 1

## 2014-01-27 NOTE — ED Notes (Signed)
Pt slipped and fell off ladder app 8 ft yesterday. Pain lt lat ribs. And low back.  Alert, No numbness or tingling.

## 2014-01-27 NOTE — ED Provider Notes (Signed)
CSN: [id number]     Arrival date & time 01/27/14  1836 History   First MD Initiated Contact with Patient 01/27/14 2017     Chief Complaint  Patient presents with  . Fall     (Consider location/radiation/quality/duration/timing/severity/associated sxs/prior Treatment) HPI Comments: And patient is a 38 year old male who presents to the emergency department with left rib and back area pain. The patient states that on yesterday (March 8) about 4:30 PM, he fell about 8 feet off of a ladder. The patient states he continued to have pain through the evening and today and presents now to the emergency department for evaluation. He denies coughing up any blood, he denies any blood in the urine or stool. He denies having any difficulty with breathing or speaking. Patient denies being on any blood thinning type medications. He denies any bleeding disorders.  Patient is a 38 y.o. male presenting with fall. The history is provided by the patient.  Fall Pertinent negatives include no abdominal pain, arthralgias, chest pain, coughing or neck pain.    Past Medical History  Diagnosis Date  . Hypertension    History reviewed. No pertinent past surgical history. No family history on file. History  Substance Use Topics  . Smoking status: Current Every Day Smoker    Types: Cigarettes  . Smokeless tobacco: Not on file  . Alcohol Use: Yes     Comment: Occ    Review of Systems  Constitutional: Negative for activity change.       All ROS Neg except as noted in HPI  HENT: Negative for nosebleeds.   Eyes: Negative for photophobia and discharge.  Respiratory: Negative for cough, shortness of breath and wheezing.   Cardiovascular: Negative for chest pain and palpitations.  Gastrointestinal: Negative for abdominal pain and blood in stool.  Genitourinary: Negative for dysuria, frequency and hematuria.  Musculoskeletal: Negative for arthralgias, back pain and neck pain.  Skin: Negative.   Neurological:  Negative for dizziness, seizures and speech difficulty.  Psychiatric/Behavioral: Negative for hallucinations and confusion.      Allergies  Review of patient's allergies indicates no known allergies.  Home Medications   Current Outpatient Rx  Name  Route  Sig  Dispense  Refill  . albuterol (PROVENTIL HFA;VENTOLIN HFA) 108 (90 BASE) MCG/ACT inhaler   Inhalation   Inhale 1-2 puffs into the lungs every 6 (six) hours as needed for wheezing or shortness of breath.   1 Inhaler   0   . amoxicillin (AMOXIL) 500 MG capsule   Oral   Take 1 capsule (500 mg total) by mouth 3 (three) times daily.   30 capsule   0   . benzonatate (TESSALON) 100 MG capsule   Oral   Take 1 capsule (100 mg total) by mouth every 8 (eight) hours.   21 capsule   0   . gabapentin (NEURONTIN) 600 MG tablet   Oral   Take 600 mg by mouth 3 (three) times daily.         Marland Kitchen. lisinopril (PRINIVIL,ZESTRIL) 10 MG tablet   Oral   Take 10 mg by mouth daily.         . promethazine-codeine (PHENERGAN WITH CODEINE) 6.25-10 MG/5ML syrup   Oral   Take 5 mLs by mouth every 4 (four) hours as needed for cough.   120 mL   0   . traMADol (ULTRAM) 50 MG tablet   Oral   Take 1 tablet (50 mg total) by mouth every 6 (six) hours as  needed.   15 tablet   0    BP 151/87  Pulse 97  Temp(Src) 98.1 F (36.7 C) (Oral)  Resp 20  Ht 5\' 9"  (1.753 m)  Wt 170 lb (77.111 kg)  BMI 25.09 kg/m2  SpO2 99% Physical Exam  Nursing note and vitals reviewed. Constitutional: He is oriented to person, place, and time. He appears well-developed and well-nourished.  Non-toxic appearance.  HENT:  Head: Normocephalic.  Right Ear: Tympanic membrane and external ear normal.  Left Ear: Tympanic membrane and external ear normal.  Eyes: EOM and lids are normal. Pupils are equal, round, and reactive to light.  Neck: Normal range of motion. Neck supple. Carotid bruit is not present.  Cardiovascular: Normal rate, regular rhythm, normal heart  sounds, intact distal pulses and normal pulses.   Pulmonary/Chest: Breath sounds normal. No respiratory distress.  Left chest wall/rib area tenderness. No palpable deformity. No crepitus. No noted bruising to the area. Symmetrical rise and fall of the chest. Patient speaks in complete sentences.  Abdominal: Soft. Bowel sounds are normal. There is no tenderness. There is no guarding.  Musculoskeletal: Normal range of motion.  No pain involving the neck. No palpable step off of the cervical spine.  There is soreness of the lower back with movement of the left hip. There is no bruising in this area. There is full range of motion of the right and left knee and ankles.  Lymphadenopathy:       Head (right side): No submandibular adenopathy present.       Head (left side): No submandibular adenopathy present.    He has no cervical adenopathy.  Neurological: He is alert and oriented to person, place, and time. He has normal strength. No cranial nerve deficit or sensory deficit.  Skin: Skin is warm and dry.  Psychiatric: He has a normal mood and affect. His speech is normal.    ED Course  Procedures (including critical care time) Labs Review Labs Reviewed - No data to display Imaging Review Dg Pelvis 1-2 Views  01/27/2014   CLINICAL DATA:  Bilateral hip pain  EXAM: PELVIS - 1-2 VIEW  COMPARISON:  None.  FINDINGS: There is no evidence of pelvic fracture or diastasis. No other pelvic bone lesions are seen.  IMPRESSION: No acute abnormality noted.   Electronically Signed   By: Alcide Clever M.D.   On: 01/27/2014 21:05   Ct Chest Wo Contrast  01/27/2014   CLINICAL DATA:  Recent traumatic injury with back pain  EXAM: CT CHEST WITHOUT CONTRAST  TECHNIQUE: Multidetector CT imaging of the chest was performed following the standard protocol without IV contrast.  COMPARISON:  None.  FINDINGS: The lungs are well aerated bilaterally. No focal infiltrate or sizable effusion is seen. No pneumothorax is noted. A few  small tiny calcified granulomas are seen. The mediastinum shows no hematoma. The visualized portions of the upper abdomen show no acute abnormality. No acute bony abnormality is noted.  IMPRESSION: No acute abnormality seen.   Electronically Signed   By: Alcide Clever M.D.   On: 01/27/2014 21:03     EKG Interpretation None      MDM Patient sustained a fall from about 8 feet off of a ladder on yesterday March 8 at about 4:30 PM. The patient complains of left rib area and lower back area pain. X-ray of the pelvis is negative for any bony deformity or fracture or dislocation. CT scan of the chest is negative for any hematoma in the  mediastinal area, no rib fractures appreciated, lungs are well aerated. Pt ambulatory with out problem.  Patient advised of the findings on his CT scan and on the pelvis film. The plan at this time is for the patient to be placed on Robaxin, diclofenac, and Norco. Patient is to follow with his primary physician for additional evaluation and management.    Final diagnoses:  None    *I have reviewed nursing notes, vital signs, and all appropriate lab and imaging results for this patient.**    Kathie Dike, PA-C 01/27/14 2122  Kathie Dike, PA-C 01/27/14 2137

## 2014-01-27 NOTE — Discharge Instructions (Signed)
D. CT scan of your chest is negative for rib fracture, or injury to the lung. The x-ray of your pelvis is negative for fracture or dislocations. Suspect that you have a deep bruise/contusion to the rib area and a strain to the back and pelvis area. Please use Robaxin and Voltaren daily until all taken. Use Norco for pain if needed. Norco and Robaxin may cause drowsiness, please use with caution. Please see Dr. Hilda LiasKeeling for orthopedic evaluation if pain not improving. Rib Contusion A rib contusion (bruise) can occur by a blow to the chest or by a fall against a hard object. Usually these will be much better in a couple weeks. If X-rays were taken today and there are no broken bones (fractures), the diagnosis of bruising is made. However, broken ribs may not show up for several days, or may be discovered later on a routine X-ray when signs of healing show up. If this happens to you, it does not mean that something was missed on the X-ray, but simply that it did not show up on the first X-rays. Earlier diagnosis will not usually change the treatment. HOME CARE INSTRUCTIONS   Avoid strenuous activity. Be careful during activities and avoid bumping the injured ribs. Activities that pull on the injured ribs and cause pain should be avoided, if possible.  For the first day or two, an ice pack used every 20 minutes while awake may be helpful. Put ice in a plastic bag and put a towel between the bag and the skin.  Eat a normal, well-balanced diet. Drink plenty of fluids to avoid constipation.  Take deep breaths several times a day to keep lungs free of infection. Try to cough several times a day. Splint the injured area with a pillow while coughing to ease pain. Coughing can help prevent pneumonia.  Wear a rib belt or binder only if told to do so by your caregiver. If you are wearing a rib belt or binder, you must do the breathing exercises as directed by your caregiver. If not used properly, rib belts or binders  restrict breathing which can lead to pneumonia.  Only take over-the-counter or prescription medicines for pain, discomfort, or fever as directed by your caregiver. SEEK MEDICAL CARE IF:   You or your child has an oral temperature above 102 F (38.9 C).  Your baby is older than 3 months with a rectal temperature of 100.5 F (38.1 C) or higher for more than 1 day.  You develop a cough, with thick or bloody sputum. SEEK IMMEDIATE MEDICAL CARE IF:   You have difficulty breathing.  You feel sick to your stomach (nausea), have vomiting or belly (abdominal) pain.  You have worsening pain, not controlled with medications, or there is a change in the location of the pain.  You develop sweating or radiation of the pain into the arms, jaw or shoulders, or become light headed or faint.  You or your child has an oral temperature above 102 F (38.9 C), not controlled by medicine.  Your or your baby is older than 3 months with a rectal temperature of 102 F (38.9 C) or higher.  Your baby is 703 months old or younger with a rectal temperature of 100.4 F (38 C) or higher. MAKE SURE YOU:   Understand these instructions.  Will watch your condition.  Will get help right away if you are not doing well or get worse. Document Released: 08/02/2001 Document Revised: 03/04/2013 Document Reviewed: 06/25/2008 [employer] Patient Information  2014 [place], [place].  Strain A strain is an injury to a muscle or the tissue that connects muscles to bones (tendon). In a strain injury, the muscle or tendon is either stretched or torn. Muscles are more susceptible to strains if they cross two joints, such as:  Hamstrings.  Quadriceps.  Calves.  Biceps. There are three categories of strains:  A first-degree strain is a small tear in the muscle. There is no lengthening of the muscle, but pain may be present with contraction of the muscle.  A second-degree strain is a small tear in the muscle accompanied by  lengthening of the muscle. Muscles with a second-degree strain are still able to function.  A third-degree strain is a complete tear of the muscle. Muscles with a third-degree strain cannot function properly. Strains often have bleeding and bruising within the muscle. SYMPTOMS   Pain, tenderness, redness or bruising, and swelling in the area of injury.  Loss of normal mobility of the injured joint. CAUSES  A sudden force exerted on a muscle or tendon that it cannot withstand usually causes strains. This may be due to a sudden overload of a contracted muscle, overuse, or sudden increase or change in activity.  RISK INCREASES WITH:  Trauma.  Poor strength and flexibility.  Failure to warm-up properly before activity.  Return to activity before healing is complete. PREVENTION  Warm-up and stretch properly before and activity.  Maintain physical fitness:  Joint flexibility.  Muscle strength.  Endurance and conditioning.  Strengthen weak muscles with exercises to prevent recurrence. PROGNOSIS  If treated properly, strains are usually curable. The time it takes to recover is related to the severity of the injury and usually varies from 2 to 8 weeks. RELATED COMPLICATIONS   Re-injury or recurrence of symptoms, permanent weakness.  Joint stiffness if the strain is severe and rehabilitation is incomplete.  Delayed healing or resolution of symptoms if sports are resumed before rehabilitation is complete.  Excessive bleeding into muscle, especially if taking anti-inflammatory medicines. This can lead to delayed recovery and injury to nerves, muscle, and blood vessels; this is an emergency. TREATMENT  Treatment initially involves ice and medicine to help reduce pain and inflammation. Use of the affected muscle should be limited by a:  Brace.  Elastic bandage wrapping.  Splint.  Cast.  Sling. Strengthening and stretching exercises may be necessary after immobilization to  prevent joint stiffness. These exercises may be completed at home or with a therapist. If the tendon is torn, then surgery may be necessary to repair it.  MEDICATION   Avoid aspirin or ibuprofen in the first 48 hours after the injury. These medicines may increase the tendency to bleed. During this time, you may take pain relievers, such as acetaminophen, that do not affect bleeding.  After the first 48 hours, if pain medicine is necessary, then nonsteroidal anti-inflammatory medicines, such as aspirin and ibuprofen, or other minor pain relievers, such as acetaminophen, are often recommended.  Do not take pain medicine within 7 days before surgery.  Prescription pain relievers may be prescribed. Use only as directed and only as much as you need  Ointments applied to the skin may be helpful. HEAT AND COLD  Cold treatment (icing) relieves pain and reduces inflammation. Cold treatment should be applied for 10 to 15 minutes every 2 to 3 hours for inflammation and pain and immediately after any activity that aggravates your symptoms. Use ice packs or massage the area with a piece of ice (ice massage).  Heat treatment may be used prior to performing the stretching and strengthening activities prescribed by your caregiver, physical therapist, or athletic trainer. Use a heat pack or soak your injury in warm water. SEEK MEDICAL CARE IF:   Symptoms get worse or do not improve despite treatment.  Pain becomes intolerable.  You experience numbness or tingling.  Toes or fingernails become cold or develop a blue, gray, or dusky color.  New, unexplained symptoms develop (drugs used in treatment may produce side effects). Document Released: 11/07/2005 Document Revised: 01/30/2012 Document Reviewed: 02/19/2009 [facility] Patient Information 2014 [place], [place].

## 2014-01-27 NOTE — ED Notes (Signed)
Pt states he fell 8 ft off of ladder yesterday. Pain to lower back and left rib area. NAD. Ambulating without difficulty.

## 2014-01-31 NOTE — ED Provider Notes (Signed)
Medical screening examination/treatment/procedure(s) were performed by non-physician practitioner and as supervising physician I was immediately available for consultation/collaboration.   EKG Interpretation None       Franci Oshana, MD 01/31/14 1431 

## 2014-04-10 ENCOUNTER — Emergency Department (HOSPITAL_COMMUNITY)
Admission: EM | Admit: 2014-04-10 | Discharge: 2014-04-10 | Disposition: A | Discharge status: Home / Self Care | Payer: Medicaid - Out of State | Attending: Emergency Medicine | Admitting: Emergency Medicine

## 2014-04-10 ENCOUNTER — Encounter (HOSPITAL_COMMUNITY): Payer: Self-pay | Admitting: Emergency Medicine

## 2014-04-10 DIAGNOSIS — F17200 Nicotine dependence, unspecified, uncomplicated: Secondary | ICD-10-CM | POA: Insufficient documentation

## 2014-04-10 DIAGNOSIS — K029 Dental caries, unspecified: Secondary | ICD-10-CM | POA: Insufficient documentation

## 2014-04-10 DIAGNOSIS — K047 Periapical abscess without sinus: Secondary | ICD-10-CM | POA: Insufficient documentation

## 2014-04-10 DIAGNOSIS — K089 Disorder of teeth and supporting structures, unspecified: Secondary | ICD-10-CM | POA: Insufficient documentation

## 2014-04-10 DIAGNOSIS — Z792 Long term (current) use of antibiotics: Secondary | ICD-10-CM | POA: Insufficient documentation

## 2014-04-10 DIAGNOSIS — G8929 Other chronic pain: Secondary | ICD-10-CM | POA: Insufficient documentation

## 2014-04-10 DIAGNOSIS — I10 Essential (primary) hypertension: Secondary | ICD-10-CM | POA: Insufficient documentation

## 2014-04-10 DIAGNOSIS — Z79899 Other long term (current) drug therapy: Secondary | ICD-10-CM | POA: Insufficient documentation

## 2014-04-10 MED ORDER — AMOXICILLIN 500 MG PO CAPS: 500.0000 mg | ORAL_CAPSULE | Freq: Three times a day (TID) | ORAL | Status: DC

## 2014-04-10 MED ORDER — IBUPROFEN 600 MG PO TABS: 600.0000 mg | ORAL_TABLET | Freq: Four times a day (QID) | ORAL | Status: DC | PRN

## 2014-04-10 MED ORDER — HYDROCODONE-ACETAMINOPHEN 5-325 MG PO TABS: 1.0000 | ORAL_TABLET | Freq: Once | ORAL | Status: DC

## 2014-04-10 MED ORDER — TRAMADOL HCL 50 MG PO TABS: 50.0000 mg | ORAL_TABLET | Freq: Four times a day (QID) | ORAL | Status: DC | PRN

## 2014-04-10 MED ORDER — IBUPROFEN 800 MG PO TABS
800.0000 mg | ORAL_TABLET | Freq: Once | ORAL | Status: AC
  Administered 2014-04-10: 800 mg via ORAL
  Filled 2014-04-10: qty 2

## 2014-04-10 MED ORDER — AMOXICILLIN 250 MG PO CAPS
500.0000 mg | ORAL_CAPSULE | Freq: Once | ORAL | Status: AC
  Administered 2014-04-10: 500 mg via ORAL
  Filled 2014-04-10: qty 2

## 2014-04-10 MED ORDER — TRAMADOL HCL 50 MG PO TABS
50.0000 mg | ORAL_TABLET | Freq: Once | ORAL | Status: AC
  Administered 2014-04-10: 50 mg via ORAL
  Filled 2014-04-10: qty 1

## 2014-04-10 NOTE — ED Notes (Signed)
Lt upper dental pain  For 2 days.

## 2014-04-10 NOTE — Discharge Instructions (Signed)
Dental Pain °Toothache is pain in or around a tooth. It may get worse with chewing or with cold or heat.  °HOME CARE °· Your dentist may use a numbing medicine during treatment. If so, you may need to avoid eating until the medicine wears off. Ask your dentist about this. °· Only take medicine as told by your dentist or doctor. °· Avoid chewing food near the painful tooth until after all treatment is done. Ask your dentist about this. °GET HELP RIGHT AWAY IF:  °· The problem gets worse or new problems appear. °· You have a fever. °· There is redness and puffiness (swelling) of the face, jaw, or neck. °· You cannot open your mouth. °· There is pain in the jaw. °· There is very bad pain that is not helped by medicine. °MAKE SURE YOU:  °· Understand these instructions. °· Will watch your condition. °· Will get help right away if you are not doing well or get worse. °Document Released: 04/25/2008 Document Revised: 01/30/2012 Document Reviewed: 04/25/2008 °[employer]® Patient Information ©2014 [employer]. ° °

## 2014-04-11 NOTE — ED Provider Notes (Signed)
CSN: [id number]     Arrival date & time 04/10/14  1402 History   First MD Initiated Contact with Patient 04/10/14 1432     Chief Complaint  Patient presents with  . Dental Pain     (Consider location/radiation/quality/duration/timing/severity/associated sxs/prior Treatment) HPI Comments: Jeff Nash is a 38 y.o. Male presenting with a 2 day history of dental pain and gingival swelling.   The patient has a history of decay in the tooth involved which has recently started to cause increased  Pain and swelling. He has had similar symptoms at this site before.  There has been no fevers, chills, nausea or vomiting, also no complaint of difficulty swallowing, although chewing makes pain worse.  The patient has tried taking BC powders without relief of symptoms.         The history is provided by the patient.    Past Medical History  Diagnosis Date  . Hypertension    History reviewed. No pertinent past surgical history. History reviewed. No pertinent family history. History  Substance Use Topics  . Smoking status: Current Every Day Smoker    Types: Cigarettes  . Smokeless tobacco: Not on file  . Alcohol Use: Yes     Comment: Occ    Review of Systems  Constitutional: Negative for fever.  HENT: Positive for dental problem. Negative for facial swelling and sore throat.   Respiratory: Negative for shortness of breath.   Musculoskeletal: Negative for neck pain and neck stiffness.      Allergies  Review of patient's allergies indicates no known allergies.  Home Medications   Prior to Admission medications   Medication Sig Start Date End Date Taking? Authorizing Provider  Aspirin-Caffeine (BC FAST PAIN RELIEF PO) Take 1 packet by mouth daily as needed (pain).   Yes Historical Provider, MD  gabapentin (NEURONTIN) 600 MG tablet Take 600 mg by mouth 3 (three) times daily.   Yes Historical Provider, MD  lisinopril (PRINIVIL,ZESTRIL) 10 MG tablet Take 10 mg by mouth daily.   Yes  Historical Provider, MD  albuterol (PROVENTIL HFA;VENTOLIN HFA) 108 (90 BASE) MCG/ACT inhaler Inhale 1-2 puffs into the lungs every 6 (six) hours as needed for wheezing or shortness of breath. 11/04/13   Burgess AmorJulie Demecia Northway, PA-C  amoxicillin (AMOXIL) 500 MG capsule Take 1 capsule (500 mg total) by mouth 3 (three) times daily. 04/10/14   Burgess AmorJulie Jovian Lembcke, PA-C  ibuprofen (ADVIL,MOTRIN) 600 MG tablet Take 1 tablet (600 mg total) by mouth every 6 (six) hours as needed. 04/10/14   Burgess AmorJulie Arohi Salvatierra, PA-C  traMADol (ULTRAM) 50 MG tablet Take 1 tablet (50 mg total) by mouth every 6 (six) hours as needed. 04/10/14   Burgess AmorJulie Brittany Amirault, PA-C   BP 138/83  Pulse 87  Temp(Src) 97.8 F (36.6 C) (Oral)  Resp 20  Ht 5\' 9"  (1.753 m)  Wt 180 lb (81.647 kg)  BMI 26.57 kg/m2  SpO2 100% Physical Exam  Constitutional: He is oriented to person, place, and time. He appears well-developed and well-nourished. No distress.  HENT:  Head: Normocephalic and atraumatic.  Right Ear: Tympanic membrane and external ear normal.  Left Ear: Tympanic membrane and external ear normal.  Mouth/Throat: Oropharynx is clear and moist and mucous membranes are normal. No oral lesions. Dental abscesses and dental caries present.  Poor dentition, multiple areas of decay.  Pain and edema localized to the left upper 2nd molar which is decayed to the gingival line.  Mild surrounding erythema, no fluctuant abscess.  Eyes: Conjunctivae are normal.  Neck: Normal range of motion. Neck supple.  Cardiovascular: Normal rate and normal heart sounds.   Pulmonary/Chest: Effort normal.  Abdominal: He exhibits no distension.  Musculoskeletal: Normal range of motion.  Lymphadenopathy:    He has no cervical adenopathy.  Neurological: He is alert and oriented to person, place, and time.  Skin: Skin is warm and dry. No erythema.  Psychiatric: He has a normal mood and affect.    ED Course  Procedures (including critical care time) Labs Review Labs Reviewed - No data to  display  Imaging Review No results found.   EKG Interpretation None      MDM   Final diagnoses:  Chronic dental pain    Pt prescribed amoxil, ibuprofen, tramadol, dental referrals given.  The patient appears reasonably screened and/or stabilized for discharge and I doubt any other medical condition or other [facility] requiring further screening, evaluation, or treatment in the ED at this time prior to discharge.     Burgess Amor, PA-C 04/11/14 [id number]

## 2014-04-15 NOTE — ED Provider Notes (Signed)
  Medical screening examination/treatment/procedure(s) were performed by non-physician practitioner and as supervising physician I was immediately available for consultation/collaboration.   EKG Interpretation None         Nao Linz, MD 04/15/14 1838 

## 2014-05-13 ENCOUNTER — Encounter (HOSPITAL_COMMUNITY): Payer: Self-pay | Admitting: Emergency Medicine

## 2014-05-13 ENCOUNTER — Emergency Department (HOSPITAL_COMMUNITY)
Admission: EM | Admit: 2014-05-13 | Discharge: 2014-05-13 | Disposition: A | Discharge status: Home / Self Care | Payer: Medicaid - Out of State | Attending: Emergency Medicine | Admitting: Emergency Medicine

## 2014-05-13 DIAGNOSIS — Z792 Long term (current) use of antibiotics: Secondary | ICD-10-CM | POA: Insufficient documentation

## 2014-05-13 DIAGNOSIS — F17200 Nicotine dependence, unspecified, uncomplicated: Secondary | ICD-10-CM | POA: Insufficient documentation

## 2014-05-13 DIAGNOSIS — I10 Essential (primary) hypertension: Secondary | ICD-10-CM | POA: Insufficient documentation

## 2014-05-13 DIAGNOSIS — K047 Periapical abscess without sinus: Secondary | ICD-10-CM | POA: Insufficient documentation

## 2014-05-13 DIAGNOSIS — K029 Dental caries, unspecified: Secondary | ICD-10-CM | POA: Insufficient documentation

## 2014-05-13 DIAGNOSIS — G8929 Other chronic pain: Secondary | ICD-10-CM | POA: Insufficient documentation

## 2014-05-13 DIAGNOSIS — Z79899 Other long term (current) drug therapy: Secondary | ICD-10-CM | POA: Insufficient documentation

## 2014-05-13 HISTORY — DX: Dental caries, unspecified: K02.9

## 2014-05-13 HISTORY — DX: Other chronic pain: G89.29

## 2014-05-13 HISTORY — DX: Pain in leg, unspecified: M79.606

## 2014-05-13 MED ORDER — AMOXICILLIN 250 MG PO CAPS
500.0000 mg | ORAL_CAPSULE | Freq: Once | ORAL | Status: AC
  Administered 2014-05-13: 500 mg via ORAL
  Filled 2014-05-13: qty 2

## 2014-05-13 MED ORDER — IBUPROFEN 600 MG PO TABS: 600.0000 mg | ORAL_TABLET | Freq: Four times a day (QID) | ORAL | Status: AC | PRN

## 2014-05-13 MED ORDER — HYDROCODONE-ACETAMINOPHEN 5-325 MG PO TABS
1.0000 | ORAL_TABLET | Freq: Once | ORAL | Status: AC
  Administered 2014-05-13: 1 via ORAL
  Filled 2014-05-13: qty 1

## 2014-05-13 MED ORDER — AMOXICILLIN 500 MG PO CAPS: 500.0000 mg | ORAL_CAPSULE | Freq: Three times a day (TID) | ORAL | Status: AC

## 2014-05-13 MED ORDER — HYDROCODONE-ACETAMINOPHEN 5-325 MG PO TABS: 1.0000 | ORAL_TABLET | ORAL | Status: DC | PRN

## 2014-05-13 NOTE — ED Notes (Signed)
Pt with extensive dental decay and active smoking reports onset of dental pain without a dentist.

## 2014-05-13 NOTE — ED Notes (Signed)
Pain lt upper molar, dental caries present.

## 2014-05-13 NOTE — Discharge Instructions (Signed)
Dental Abscess A dental abscess is a collection of infected fluid (pus) from a bacterial infection in the inner part of the tooth (pulp). It usually occurs at the end of the tooth's root.  CAUSES   Severe tooth decay.  Trauma to the tooth that allows bacteria to enter into the pulp, such as a broken or chipped tooth. SYMPTOMS   Severe pain in and around the infected tooth.  Swelling and redness around the abscessed tooth or in the mouth or face.  Tenderness.  Pus drainage.  Bad breath.  Bitter taste in the mouth.  Difficulty swallowing.  Difficulty opening the mouth.  Nausea.  Vomiting.  Chills.  Swollen neck glands. DIAGNOSIS   A medical and dental history will be taken.  An examination will be performed by tapping on the abscessed tooth.  X-rays may be taken of the tooth to identify the abscess. TREATMENT The goal of treatment is to eliminate the infection. You may be prescribed antibiotic medicine to stop the infection from spreading. A root canal may be performed to save the tooth. If the tooth cannot be saved, it may be pulled (extracted) and the abscess may be drained.  HOME CARE INSTRUCTIONS  Only take over-the-counter or prescription medicines for pain, fever, or discomfort as directed by your caregiver.  Rinse your mouth (gargle) often with salt water ( tsp salt in 8 oz [250 ml] of warm water) to relieve pain or swelling.  Do not drive after taking pain medicine (narcotics).  Do not apply heat to the outside of your face.  Return to your dentist for further treatment as directed. SEEK MEDICAL CARE IF:  Your pain is not helped by medicine.  Your pain is getting worse instead of better. SEEK IMMEDIATE MEDICAL CARE IF:  You have a fever or persistent symptoms for more than 2-3 days.  You have a fever and your symptoms suddenly get worse.  You have chills or a very bad headache.  You have problems breathing or swallowing.  You have trouble  opening your mouth.  You have swelling in the neck or around the eye. Document Released: 11/07/2005 Document Revised: 08/01/2012 Document Reviewed: 02/15/2011 [facility] Patient Information 2015 [place]. This information is not intended to replace advice given to you by your health care provider. Make sure you discuss any questions you have with your health care provider.   Complete your entire course of antibiotics as prescribed.  You  may use the hydrocodone for pain relief but do not drive within 4 hours of taking as this will make you drowsy.  Avoid applying heat or ice to this abscess area which can worsen your symptoms.  You may use warm salt water swish and spit treatment or half peroxide and water swish and spit after meals to keep this area clean as discussed.  Call the dentist listed above for further management of your symptoms.  Once you have had the antibiotic for 24 hours, you should no longer need the hydrocodone.

## 2014-05-14 NOTE — ED Provider Notes (Signed)
CSN: [id number]     Arrival date & time 05/13/14  1251 History   First MD Initiated Contact with Patient 05/13/14 1309     Chief Complaint  Patient presents with  . Dental Pain     (Consider location/radiation/quality/duration/timing/severity/associated sxs/prior Treatment) The history is provided by the patient.    Jeff Nash is a 38 y.o. male presenting with 1 week history of dental pain and gingival swelling.   The patient has a history of generalized dental decay and multiple cavities and has an appointment with his dentist next week, but in the interim has developed worsened pain and now early swelling along his left upper gingiva.  He is concerned about possible abscess or infection.  There has been no fevers, chills, nausea or vomiting, also no complaint of difficulty swallowing, although chewing makes pain worse.  The patient has tried tylenol without relief of symptoms.         Past Medical History  Diagnosis Date  . Dental decay   . Hypertension   . Chronic leg pain    History reviewed. No pertinent past surgical history. No family history on file. History  Substance Use Topics  . Smoking status: Current Every Day Smoker -- 1.00 packs/day  . Smokeless tobacco: Not on file  . Alcohol Use: Yes    Review of Systems  Constitutional: Negative for fever.  HENT: Positive for dental problem. Negative for facial swelling and sore throat.   Respiratory: Negative for shortness of breath.   Musculoskeletal: Negative for neck pain and neck stiffness.      Allergies  Tramadol  Home Medications   Prior to Admission medications   Medication Sig Start Date End Date Taking? Authorizing Provider  gabapentin (NEURONTIN) 600 MG tablet Take 600 mg by mouth 3 (three) times daily.   Yes Historical Provider, MD  lisinopril (PRINIVIL,ZESTRIL) 10 MG tablet Take 10 mg by mouth daily.   Yes Historical Provider, MD  amoxicillin (AMOXIL) 500 MG capsule Take 1 capsule (500 mg total) by  mouth 3 (three) times daily. 05/13/14 05/23/14  Burgess AmorJulie Idol, PA-C  HYDROcodone-acetaminophen (NORCO/VICODIN) 5-325 MG per tablet Take 1 tablet by mouth every 4 (four) hours as needed for moderate pain. 05/13/14   Burgess AmorJulie Idol, PA-C  ibuprofen (ADVIL,MOTRIN) 600 MG tablet Take 1 tablet (600 mg total) by mouth every 6 (six) hours as needed. 05/13/14   Burgess AmorJulie Idol, PA-C   BP 134/81  Pulse 81  Temp(Src) 98.3 F (36.8 C) (Oral)  Resp 18  SpO2 98% Physical Exam  Constitutional: He is oriented to person, place, and time. He appears well-developed and well-nourished. No distress.  HENT:  Head: Normocephalic and atraumatic.  Right Ear: Tympanic membrane and external ear normal.  Left Ear: Tympanic membrane and external ear normal.  Mouth/Throat: Oropharynx is clear and moist and mucous membranes are normal. No oral lesions. No trismus in the jaw. Dental abscesses and dental caries present.  Multiple dental cavities, several molar teeth are fractured to the gingival line.  There is mild edema and erythema along left upper gingiva. No fluctuant abscess.  No facial edema , erythema or induration.  Eyes: Conjunctivae are normal.  Neck: Normal range of motion. Neck supple.  Cardiovascular: Normal rate and normal heart sounds.   Pulmonary/Chest: Effort normal.  Abdominal: He exhibits no distension.  Musculoskeletal: Normal range of motion.  Lymphadenopathy:    He has no cervical adenopathy.  Neurological: He is alert and oriented to person, place, and time.  Skin: Skin  is warm and dry. No erythema.  Psychiatric: He has a normal mood and affect.    ED Course  Procedures (including critical care time) Labs Review Labs Reviewed - No data to display  Imaging Review No results found.   EKG Interpretation None      MDM   Final diagnoses:  Dental abscess    Pt was prescribed amoxil,  Hydrocodone #5 until the abx is effective, ibuprofen.  F/u with dentist next week as planned.       Burgess AmorJulie  Idol, PA-C 05/14/14 2317

## 2014-05-15 NOTE — ED Provider Notes (Signed)
Medical screening examination/treatment/procedure(s) were performed by non-physician practitioner and as supervising physician I was immediately available for consultation/collaboration.   EKG Interpretation None        Joseph L Zammit, MD 05/15/14 1018 

## 2014-06-06 ENCOUNTER — Emergency Department (HOSPITAL_COMMUNITY): Payer: Medicaid Other

## 2014-06-06 ENCOUNTER — Emergency Department (HOSPITAL_COMMUNITY)
Admission: EM | Admit: 2014-06-06 | Discharge: 2014-06-06 | Disposition: A | Discharge status: Home / Self Care | Payer: Medicaid Other | Attending: Emergency Medicine | Admitting: Emergency Medicine

## 2014-06-06 ENCOUNTER — Encounter (HOSPITAL_COMMUNITY): Payer: Self-pay | Admitting: Emergency Medicine

## 2014-06-06 DIAGNOSIS — Z79899 Other long term (current) drug therapy: Secondary | ICD-10-CM | POA: Diagnosis not present

## 2014-06-06 DIAGNOSIS — Z8701 Personal history of pneumonia (recurrent): Secondary | ICD-10-CM | POA: Insufficient documentation

## 2014-06-06 DIAGNOSIS — M546 Pain in thoracic spine: Secondary | ICD-10-CM | POA: Insufficient documentation

## 2014-06-06 DIAGNOSIS — R0602 Shortness of breath: Secondary | ICD-10-CM | POA: Diagnosis not present

## 2014-06-06 DIAGNOSIS — R011 Cardiac murmur, unspecified: Secondary | ICD-10-CM | POA: Insufficient documentation

## 2014-06-06 DIAGNOSIS — F17200 Nicotine dependence, unspecified, uncomplicated: Secondary | ICD-10-CM | POA: Diagnosis not present

## 2014-06-06 DIAGNOSIS — M25519 Pain in unspecified shoulder: Secondary | ICD-10-CM | POA: Diagnosis present

## 2014-06-06 DIAGNOSIS — I10 Essential (primary) hypertension: Secondary | ICD-10-CM | POA: Insufficient documentation

## 2014-06-06 MED ORDER — GABAPENTIN 300 MG PO CAPS: 300.0000 mg | ORAL_CAPSULE | Freq: Three times a day (TID) | ORAL | Status: DC

## 2014-06-06 MED ORDER — TRAMADOL HCL 50 MG PO TABS: 50.0000 mg | ORAL_TABLET | Freq: Four times a day (QID) | ORAL | Status: DC | PRN

## 2014-06-06 MED ORDER — HYDROCODONE-ACETAMINOPHEN 5-325 MG PO TABS
1.0000 | ORAL_TABLET | Freq: Once | ORAL | Status: AC
  Administered 2014-06-06: 1 via ORAL
  Filled 2014-06-06: qty 1

## 2014-06-06 MED ORDER — PROMETHAZINE HCL 25 MG PO TABS: 25.0000 mg | ORAL_TABLET | Freq: Four times a day (QID) | ORAL | Status: DC | PRN

## 2014-06-06 MED ORDER — NAPROXEN 500 MG PO TABS: 500.0000 mg | ORAL_TABLET | Freq: Two times a day (BID) | ORAL | Status: DC

## 2014-06-06 MED ORDER — HYDROCODONE-ACETAMINOPHEN 5-325 MG PO TABS
1.0000 | ORAL_TABLET | Freq: Four times a day (QID) | ORAL | Status: DC | PRN
Start: 2014-06-06 — End: 2014-11-19

## 2014-06-06 NOTE — ED Notes (Signed)
Pain upper back to shoulders worse with deep breath, feels short of breath with talking. [facility] to no [facility]. Now feels like he has pneumonia again.

## 2014-06-06 NOTE — ED Provider Notes (Signed)
CSN: [id number]     Arrival date & time 06/06/14  1929 History   First MD Initiated Contact with Patient 06/06/14 2131   This chart was scribed for Jeff LennertJoseph L Shenice Dolder, MD by Gwenevere AbbotAlexis Brown, ED scribe. This patient was seen in room APA03/APA03 and the patient's care was started at 9:37 PM.    Chief Complaint  Patient presents with  . Pneumonia  . Shoulder Pain   Patient is a [age] y.o. male presenting with back pain. The history is provided by the patient (pt complains of back pain). No language interpreter was used.  Back Pain Location:  Thoracic spine Quality:  Aching Radiates to:  Does not radiate Pain severity:  Moderate Onset quality:  Sudden Associated symptoms comment:  SOB   HPI Comments:  Jeff Nash is a 38 y.o. male who presents to the Emergency Department complaining of constant, severe upper back pain, with associated symptoms of SOB. Pt states that he had an episode of pneumonia two months ago, and was also told that he has a heart murmur. Pt reports that he smokes half a pack a day. Pt states that he is a [employer].  Past Medical History  Diagnosis Date  . Hypertension    History reviewed. No pertinent past surgical history. No family history on file. History  Substance Use Topics  . Smoking status: Current Every Day Smoker -- 0.50 packs/day for 20 years    Types: Cigarettes  . Smokeless tobacco: Not on file  . Alcohol Use: Yes     Comment: Occ    Review of Systems  Constitutional: Negative for appetite change and fatigue.  HENT: Negative for congestion, ear discharge and sinus pressure.   Eyes: Negative for discharge.  Respiratory: Negative for cough.   Gastrointestinal: Negative for diarrhea.  Genitourinary: Negative for frequency and hematuria.  Musculoskeletal: Positive for back pain.  Skin: Negative for rash.  Neurological: Negative for seizures.  Psychiatric/Behavioral: Negative for hallucinations.      Allergies  Review of patient's allergies  indicates no known allergies.  Home Medications   Prior to Admission medications   Medication Sig Start Date End Date Taking? Authorizing Provider  albuterol (PROVENTIL HFA;VENTOLIN HFA) 108 (90 BASE) MCG/ACT inhaler Inhale 1-2 puffs into the lungs every 6 (six) hours as needed for wheezing or shortness of breath. 11/04/13  Yes Raynelle FanningJulie Idol, PA-C  Aspirin-Caffeine (BC FAST PAIN RELIEF PO) Take 1 packet by mouth daily as needed (pain).   Yes Historical Provider, MD  gabapentin (NEURONTIN) 600 MG tablet Take 600 mg by mouth 3 (three) times daily.   Yes Historical Provider, MD  lisinopril (PRINIVIL,ZESTRIL) 10 MG tablet Take 10 mg by mouth daily.   Yes Historical Provider, MD   BP 137/80  Pulse 76  Temp(Src) 98.1 F (36.7 C) (Oral)  Resp 16  Ht 5\' 9"  (1.753 m)  Wt 183 lb (83.008 kg)  BMI 27.01 kg/m2  SpO2 100% Physical Exam  Constitutional: He is oriented to person, place, and time. He appears well-developed.  HENT:  Head: Normocephalic.  Eyes: Conjunctivae and EOM are normal. No scleral icterus.  Neck: Neck supple. No thyromegaly present.  Cardiovascular: Normal rate and regular rhythm.  Exam reveals no gallop and no friction rub.   No murmur heard. Pulmonary/Chest: No stridor. He has no wheezes. He has no rales. He exhibits no tenderness.  Abdominal: He exhibits no distension. There is no tenderness. There is no rebound.  Musculoskeletal: Normal range of motion. He exhibits tenderness. He  exhibits no edema.  Mild to moderate thoracic spine tenderness   Lymphadenopathy:    He has no cervical adenopathy.  Neurological: He is oriented to person, place, and time. He exhibits normal muscle tone. Coordination normal.  Skin: No rash noted. No erythema.  Psychiatric: He has a normal mood and affect. His behavior is normal.    ED Course  Procedures  DIAGNOSTIC STUDIES: Oxygen Saturation is 100% on RA, normal by my interpretation.  COORDINATION OF CARE: 9:40 PM-Discussed treatment  plan with pt at bedside and pt agreed to plan.   EKG Interpretation None      MDM   Final diagnoses:  None    The chart was scribed for me under my direct supervision.  I personally performed the history, physical, and medical decision making and all procedures in the evaluation of this patient.Jeff Lennert, MD 06/06/14 [phone]

## 2014-06-06 NOTE — Discharge Instructions (Signed)
Follow up as needed

## 2014-06-09 NOTE — ED Provider Notes (Signed)
[facility]. The pharmacist checked narcotic database and found rx for subutex filed at different pharmacy and not on our current med list. Will decline Norco rx.   Shanna CiscoMegan E Docherty, MD 06/09/14 [phone]

## 2014-11-01 ENCOUNTER — Encounter (HOSPITAL_COMMUNITY): Payer: Self-pay | Admitting: Emergency Medicine

## 2014-11-19 ENCOUNTER — Emergency Department (HOSPITAL_COMMUNITY): Payer: Medicaid Other

## 2014-11-19 ENCOUNTER — Encounter (HOSPITAL_COMMUNITY): Payer: Self-pay | Admitting: Emergency Medicine

## 2014-11-19 ENCOUNTER — Emergency Department (HOSPITAL_COMMUNITY)
Admission: EM | Admit: 2014-11-19 | Discharge: 2014-11-19 | Disposition: A | Discharge status: Home / Self Care | Payer: Medicaid Other | Attending: Emergency Medicine | Admitting: Emergency Medicine

## 2014-11-19 DIAGNOSIS — R109 Unspecified abdominal pain: Secondary | ICD-10-CM

## 2014-11-19 DIAGNOSIS — G8929 Other chronic pain: Secondary | ICD-10-CM | POA: Insufficient documentation

## 2014-11-19 DIAGNOSIS — Z8719 Personal history of other diseases of the digestive system: Secondary | ICD-10-CM | POA: Insufficient documentation

## 2014-11-19 DIAGNOSIS — I10 Essential (primary) hypertension: Secondary | ICD-10-CM | POA: Diagnosis not present

## 2014-11-19 DIAGNOSIS — R1011 Right upper quadrant pain: Secondary | ICD-10-CM | POA: Insufficient documentation

## 2014-11-19 DIAGNOSIS — Z79899 Other long term (current) drug therapy: Secondary | ICD-10-CM | POA: Insufficient documentation

## 2014-11-19 DIAGNOSIS — R630 Anorexia: Secondary | ICD-10-CM | POA: Insufficient documentation

## 2014-11-19 DIAGNOSIS — R1013 Epigastric pain: Secondary | ICD-10-CM | POA: Diagnosis present

## 2014-11-19 DIAGNOSIS — Z720 Tobacco use: Secondary | ICD-10-CM | POA: Insufficient documentation

## 2014-11-19 LAB — CBC WITH DIFFERENTIAL/PLATELET
Basophils Absolute: 0 10*3/uL (ref 0.0–0.1)
Basophils Relative: 0 % (ref 0–1)
EOS PCT: 8 % — AB (ref 0–5)
Eosinophils Absolute: 0.8 10*3/uL — ABNORMAL HIGH (ref 0.0–0.7)
HCT: 49.2 % (ref 39.0–52.0)
Hemoglobin: 16.6 g/dL (ref 13.0–17.0)
LYMPHS PCT: 19 % (ref 12–46)
Lymphs Abs: 1.8 10*3/uL (ref 0.7–4.0)
MCH: 32.4 pg (ref 26.0–34.0)
MCHC: 33.7 g/dL (ref 30.0–36.0)
MCV: 96.1 fL (ref 78.0–100.0)
Monocytes Absolute: 0.9 10*3/uL (ref 0.1–1.0)
Monocytes Relative: 9 % (ref 3–12)
NEUTROS ABS: 6.1 10*3/uL (ref 1.7–7.7)
Neutrophils Relative %: 64 % (ref 43–77)
PLATELETS: 214 10*3/uL (ref 150–400)
RBC: 5.12 MIL/uL (ref 4.22–5.81)
RDW: 13 % (ref 11.5–15.5)
WBC: 9.7 10*3/uL (ref 4.0–10.5)

## 2014-11-19 LAB — COMPREHENSIVE METABOLIC PANEL
ALT: 15 U/L (ref 0–53)
AST: 19 U/L (ref 0–37)
Albumin: 4.5 g/dL (ref 3.5–5.2)
Alkaline Phosphatase: 59 U/L (ref 39–117)
Anion gap: 6 (ref 5–15)
BUN: 10 mg/dL (ref 6–23)
CALCIUM: 9 mg/dL (ref 8.4–10.5)
CHLORIDE: 105 meq/L (ref 96–112)
CO2: 29 mmol/L (ref 19–32)
CREATININE: 1.16 mg/dL (ref 0.50–1.35)
GFR calc Af Amer: >90 mL/min (ref >90)
GFR, EST NON AFRICAN AMERICAN: 79 mL/min — AB (ref >90)
Glucose, Bld: 95 mg/dL (ref 70–99)
Potassium: 4.1 mmol/L (ref 3.5–5.1)
SODIUM: 140 mmol/L (ref 135–145)
Total Bilirubin: 0.8 mg/dL (ref 0.3–1.2)
Total Protein: 7.2 g/dL (ref 6.0–8.3)

## 2014-11-19 LAB — URINALYSIS, ROUTINE W REFLEX MICROSCOPIC
Bilirubin Urine: NEGATIVE
Glucose, UA: 250 mg/dL — AB
Hgb urine dipstick: NEGATIVE
Ketones, ur: NEGATIVE mg/dL
LEUKOCYTES UA: NEGATIVE
NITRITE: NEGATIVE
Protein, ur: NEGATIVE mg/dL
SPECIFIC GRAVITY, URINE: 1.025 (ref 1.005–1.030)
Urobilinogen, UA: 0.2 mg/dL (ref 0.0–1.0)
pH: 6 (ref 5.0–8.0)

## 2014-11-19 LAB — LIPASE, BLOOD: LIPASE: 24 U/L (ref 11–59)

## 2014-11-19 MED ORDER — SODIUM CHLORIDE 0.9 % IV BOLUS (SEPSIS)
1000.0000 mL | Freq: Once | INTRAVENOUS | Status: AC
  Administered 2014-11-19: 1000 mL via INTRAVENOUS

## 2014-11-19 MED ORDER — HYDROMORPHONE HCL 1 MG/ML IJ SOLN
1.0000 mg | INTRAMUSCULAR | Status: DC | PRN
  Administered 2014-11-19 (×2): 1 mg via INTRAVENOUS
  Filled 2014-11-19 (×2): qty 1

## 2014-11-19 MED ORDER — ONDANSETRON HCL 4 MG/2ML IJ SOLN
4.0000 mg | Freq: Once | INTRAMUSCULAR | Status: AC
  Administered 2014-11-19: 4 mg via INTRAVENOUS
  Filled 2014-11-19: qty 2

## 2014-11-19 MED ORDER — OMEPRAZOLE 20 MG PO CPDR: 20.0000 mg | DELAYED_RELEASE_CAPSULE | Freq: Two times a day (BID) | ORAL | Status: DC

## 2014-11-19 NOTE — Discharge Instructions (Signed)

## 2014-11-19 NOTE — ED Notes (Signed)
Patient complaining of abdominal pain starting yesterday. Denies n/v/d.

## 2014-11-19 NOTE — ED Provider Notes (Signed)
CSN: [id number]     Arrival date & time 11/19/14  1123 History   First MD Initiated Contact with Patient 11/19/14 1139     Chief Complaint  Patient presents with  . Abdominal Pain    Patient is a 38 y.o. male presenting with abdominal pain. The history is provided by the patient.  Abdominal Pain Pain location:  Epigastric Pain quality: aching   Pain radiation: radiates towards the lower abdomen. Pain severity:  Severe Onset quality:  Gradual Duration:  1 day Timing:  Constant (but gets more severe in waves) Progression:  Worsening Chronicity:  New Context: not alcohol use, not diet changes, not recent illness, not sick contacts and not trauma   Relieved by:  Nothing Worsened by:  Eating and position changes Ineffective treatments:  None tried Associated symptoms: anorexia   Associated symptoms: no cough, no diarrhea, no fever and no vomiting   Risk factors: no alcohol abuse, has not had multiple surgeries and no recent hospitalization     Past Medical History  Diagnosis Date  . Dental decay   . Chronic leg pain   . Hypertension    History reviewed. No pertinent past surgical history. History reviewed. No pertinent family history. History  Substance Use Topics  . Smoking status: Current Every Day Smoker -- 0.50 packs/day for 20 years    Types: Cigarettes  . Smokeless tobacco: Not on file  . Alcohol Use: Yes     Comment: Occ    Review of Systems  Constitutional: Negative for fever.  Respiratory: Negative for cough.   Gastrointestinal: Positive for abdominal pain and anorexia. Negative for vomiting and diarrhea.  All other systems reviewed and are negative.     Allergies  Tramadol  Home Medications   Prior to Admission medications   Medication Sig Start Date End Date Taking? Authorizing Provider  albuterol (PROVENTIL HFA;VENTOLIN HFA) 108 (90 BASE) MCG/ACT inhaler Inhale 1-2 puffs into the lungs every 6 (six) hours as needed for wheezing or shortness of  breath. 11/04/13  Yes Raynelle FanningJulie Idol, PA-C  gabapentin (NEURONTIN) 600 MG tablet Take 600 mg by mouth 3 (three) times daily.   Yes Historical Provider, MD  ibuprofen (ADVIL,MOTRIN) 600 MG tablet Take 1 tablet (600 mg total) by mouth every 6 (six) hours as needed. 05/13/14  Yes Raynelle FanningJulie Idol, PA-C  lisinopril (PRINIVIL,ZESTRIL) 10 MG tablet Take 10 mg by mouth daily.   Yes Historical Provider, MD  SUBOXONE 8-2 MG FILM Take 1 Film by mouth 3 (three) times daily. 11/01/14  Yes Historical Provider, MD  omeprazole (PRILOSEC) 20 MG capsule Take 1 capsule (20 mg total) by mouth 2 (two) times daily before a meal. 11/19/14   Linwood DibblesJon Jeromey Kruer, MD   BP 147/84 mmHg  Pulse 77  Temp(Src) 98 F (36.7 C) (Oral)  Resp 16  Ht 5\' 9"  (1.753 m)  Wt 185 lb (83.915 kg)  BMI 27.31 kg/m2  SpO2 100% Physical Exam  Constitutional: He appears well-developed and well-nourished. No distress.  HENT:  Head: Normocephalic and atraumatic.  Right Ear: External ear normal.  Left Ear: External ear normal.  Eyes: Conjunctivae are normal. Right eye exhibits no discharge. Left eye exhibits no discharge. No scleral icterus.  Neck: Neck supple. No tracheal deviation present.  Cardiovascular: Normal rate, regular rhythm and intact distal pulses.   Pulmonary/Chest: Effort normal and breath sounds normal. No stridor. No respiratory distress. He has no wheezes. He has no rales.  Abdominal: Soft. Bowel sounds are normal. He exhibits no distension,  no ascites and no mass. There is tenderness in the right upper quadrant and epigastric area. There is guarding. There is no rigidity, no rebound and no CVA tenderness. No hernia.  Musculoskeletal: He exhibits no edema or tenderness.  Neurological: He is alert. He has normal strength. No cranial nerve deficit (no facial droop, extraocular movements intact, no slurred speech) or sensory deficit. He exhibits normal muscle tone. He displays no seizure activity. Coordination normal.  Skin: Skin is warm and  dry. No rash noted.  Psychiatric: He has a normal mood and affect.  Nursing note and vitals reviewed.   ED Course  Procedures (including critical care time) Labs Review Labs Reviewed  CBC WITH DIFFERENTIAL - Abnormal; Notable for the following:    Eosinophils Relative 8 (*)    Eosinophils Absolute 0.8 (*)    All other components within normal limits  COMPREHENSIVE METABOLIC PANEL - Abnormal; Notable for the following:    GFR calc non Af Amer 79 (*)    All other components within normal limits  URINALYSIS, ROUTINE W REFLEX MICROSCOPIC - Abnormal; Notable for the following:    Glucose, UA 250 (*)    All other components within normal limits  LIPASE, BLOOD    Imaging Review [place] Abdomen Complete  11/19/2014   CLINICAL DATA:  Mid abdominal pain since yesterday. Nausea, vomiting.  EXAM: ULTRASOUND ABDOMEN COMPLETE  COMPARISON:  None.  FINDINGS: Gallbladder: No gallstones or wall thickening visualized. No sonographic Murphy sign noted.  Common bile duct: Diameter: Upper limits normal at 6.8 mm in diameter.  Liver: No focal lesion identified. Within normal limits in parenchymal echogenicity.  IVC: No abnormality visualized.  Pancreas: Visualized portion unremarkable.  Spleen: Size and appearance within normal limits.  Right Kidney: Length: 10.6 cm. Echogenicity within normal limits. No mass or hydronephrosis visualized.  Left Kidney: Length: 10.8 cm. Echogenicity within normal limits. No mass or hydronephrosis visualized.  Abdominal aorta: No aneurysm visualized.  Other findings: None.  IMPRESSION: Unremarkable abdominal ultrasound.   Electronically Signed   By: Charlett NoseKevin  Dover M.D.   On: 11/19/2014 14:43    Medications  HYDROmorphone (DILAUDID) injection 1 mg (1 mg Intravenous Given 11/19/14 1236)  sodium chloride 0.9 % bolus 1,000 mL (1,000 mLs Intravenous New Bag/Given 11/19/14 1211)  ondansetron (ZOFRAN) injection 4 mg (4 mg Intravenous Given 11/19/14 1211)     MDM   Final diagnoses:   Abdominal pain, acute    On repeat exam, still with ttp ruq and epigastric region.    Labs normal.  US of abdomen without acute abnormality.  Doubt gallbladder, pancreas , renal etiology.  Symptoms and exam don't suggest appendicitis.  Dc home with antacids.  Follow up with  A PCP or GI.  Return to the ED to be rechecked in 24 hours if symptoms persist.    Linwood DibblesJon Ladavion Savitz, MD 11/19/14 [phone]

## 2014-11-25 ENCOUNTER — Encounter: Payer: Self-pay | Admitting: Gastroenterology

## 2014-12-13 ENCOUNTER — Emergency Department (HOSPITAL_COMMUNITY)
Admission: EM | Admit: 2014-12-13 | Discharge: 2014-12-13 | Disposition: A | Discharge status: Home / Self Care | Payer: Medicaid Other | Attending: Emergency Medicine | Admitting: Emergency Medicine

## 2014-12-13 ENCOUNTER — Encounter (HOSPITAL_COMMUNITY): Payer: Self-pay | Admitting: Emergency Medicine

## 2014-12-13 ENCOUNTER — Emergency Department (HOSPITAL_COMMUNITY): Payer: Medicaid Other

## 2014-12-13 DIAGNOSIS — Y9289 Other specified places as the place of occurrence of the external cause: Secondary | ICD-10-CM | POA: Diagnosis not present

## 2014-12-13 DIAGNOSIS — Y998 Other external cause status: Secondary | ICD-10-CM | POA: Insufficient documentation

## 2014-12-13 DIAGNOSIS — Y9389 Activity, other specified: Secondary | ICD-10-CM | POA: Insufficient documentation

## 2014-12-13 DIAGNOSIS — Z8719 Personal history of other diseases of the digestive system: Secondary | ICD-10-CM | POA: Diagnosis not present

## 2014-12-13 DIAGNOSIS — Z79899 Other long term (current) drug therapy: Secondary | ICD-10-CM | POA: Diagnosis not present

## 2014-12-13 DIAGNOSIS — S300XXA Contusion of lower back and pelvis, initial encounter: Secondary | ICD-10-CM | POA: Diagnosis not present

## 2014-12-13 DIAGNOSIS — I10 Essential (primary) hypertension: Secondary | ICD-10-CM | POA: Insufficient documentation

## 2014-12-13 DIAGNOSIS — G8929 Other chronic pain: Secondary | ICD-10-CM | POA: Diagnosis not present

## 2014-12-13 DIAGNOSIS — Z720 Tobacco use: Secondary | ICD-10-CM | POA: Insufficient documentation

## 2014-12-13 DIAGNOSIS — S20229A Contusion of unspecified back wall of thorax, initial encounter: Secondary | ICD-10-CM

## 2014-12-13 DIAGNOSIS — W1839XA Other fall on same level, initial encounter: Secondary | ICD-10-CM | POA: Diagnosis not present

## 2014-12-13 DIAGNOSIS — W19XXXA Unspecified fall, initial encounter: Secondary | ICD-10-CM

## 2014-12-13 DIAGNOSIS — S1093XA Contusion of unspecified part of neck, initial encounter: Secondary | ICD-10-CM

## 2014-12-13 DIAGNOSIS — S3992XA Unspecified injury of lower back, initial encounter: Secondary | ICD-10-CM | POA: Diagnosis present

## 2014-12-13 MED ORDER — METHOCARBAMOL 500 MG PO TABS: 500.0000 mg | ORAL_TABLET | Freq: Two times a day (BID) | ORAL | Status: AC | PRN

## 2014-12-13 MED ORDER — HYDROCODONE-ACETAMINOPHEN 5-325 MG PO TABS
2.0000 | ORAL_TABLET | Freq: Once | ORAL | Status: AC
  Administered 2014-12-13: 2 via ORAL
  Filled 2014-12-13: qty 2

## 2014-12-13 MED ORDER — KETOROLAC TROMETHAMINE 60 MG/2ML IM SOLN
60.0000 mg | Freq: Once | INTRAMUSCULAR | Status: AC
  Administered 2014-12-13: 60 mg via INTRAMUSCULAR
  Filled 2014-12-13: qty 2

## 2014-12-13 MED ORDER — NAPROXEN 500 MG PO TABS: 500.0000 mg | ORAL_TABLET | Freq: Two times a day (BID) | ORAL | Status: AC

## 2014-12-13 NOTE — ED Notes (Signed)
Patient c/o neck and back pain after slipping and falling on icy porch this morning. Denies hitting head or LOC. Patient denies any problems with voiding or BMs. C-collar placed on patient.

## 2014-12-13 NOTE — ED Provider Notes (Signed)
CSN: [id number]     Arrival date & time 12/13/14  1232 History   First MD Initiated Contact with Patient 12/13/14 1257     Chief Complaint  Patient presents with  . Fall     (Consider location/radiation/quality/duration/timing/severity/associated sxs/prior Treatment) HPI Comments: 39 year old male, reports back and neck pain after falling on his lower back and neck this morning while trying to shovel snow off of his back porch and stairs. He denies any numbness weakness urinary incontinence but does have increased pain with walking and palpation. This occurred this morning, the pain is persistent, worse with range of motion, not associated with head injury or loss of consciousness.  Patient is a 39 y.o. male presenting with fall. The history is provided by the patient.  Fall Pertinent negatives include no headaches and no shortness of breath.    Past Medical History  Diagnosis Date  . Dental decay   . Chronic leg pain   . Hypertension    History reviewed. No pertinent past surgical history. Family History  Problem Relation Age of Onset  . Diabetes Mother   . Diabetes Father    History  Substance Use Topics  . Smoking status: Current Every Day Smoker -- 0.50 packs/day for 20 years    Types: Cigarettes  . Smokeless tobacco: Never Used  . Alcohol Use: Yes     Comment: Occ    Review of Systems  Eyes: Negative for visual disturbance.  Respiratory: Negative for shortness of breath.   Gastrointestinal: Negative for nausea and vomiting.  Musculoskeletal: Positive for back pain and neck pain. Negative for joint swelling.  Skin: Negative for wound.  Neurological: Negative for weakness, numbness and headaches.      Allergies  Tramadol  Home Medications   Prior to Admission medications   Medication Sig Start Date End Date Taking? Authorizing Provider  albuterol (PROVENTIL HFA;VENTOLIN HFA) 108 (90 BASE) MCG/ACT inhaler Inhale 1-2 puffs into the lungs every 6 (six) hours as  needed for wheezing or shortness of breath. 11/04/13  Yes Burgess AmorJulie Idol, PA-C  ibuprofen (ADVIL,MOTRIN) 200 MG tablet Take 800 mg by mouth every 6 (six) hours as needed for headache.   Yes Historical Provider, MD  SUBOXONE 8-2 MG FILM Take 1 Film by mouth 3 (three) times daily. 11/01/14  Yes Historical Provider, MD  ibuprofen (ADVIL,MOTRIN) 600 MG tablet Take 1 tablet (600 mg total) by mouth every 6 (six) hours as needed. Patient not taking: Reported on 12/13/2014 05/13/14   Burgess AmorJulie Idol, PA-C  methocarbamol (ROBAXIN) 500 MG tablet Take 1 tablet (500 mg total) by mouth 2 (two) times daily as needed for muscle spasms. 12/13/14   Vida RollerBrian D Leonides Minder, MD  naproxen (NAPROSYN) 500 MG tablet Take 1 tablet (500 mg total) by mouth 2 (two) times daily with a meal. 12/13/14   Vida RollerBrian D Jacquilyn Seldon, MD   BP 132/100 mmHg  Pulse 84  Temp(Src) 98.2 F (36.8 C) (Oral)  Resp 18  Ht 5\' 9"  (1.753 m)  Wt 185 lb (83.915 kg)  BMI 27.31 kg/m2  SpO2 100% Physical Exam  Constitutional: He appears well-developed and well-nourished. No distress.  HENT:  Head: Normocephalic and atraumatic.  Eyes: Conjunctivae are normal. Right eye exhibits no discharge. Left eye exhibits no discharge. No scleral icterus.  Cardiovascular: Normal rate and regular rhythm.   Pulmonary/Chest: Effort normal and breath sounds normal.  Musculoskeletal: He exhibits tenderness ( Tender to palpation over the paraspinal muscles of the cervical spine as well as the mid lumbar  spine, no deformity, no bruising). He exhibits no edema.  No spinal tenderness over the cervical or thoracic spine  Neurological:  Speech is clear, strength in the UE and LE's are normal at the major muscle groups including the hip, knee and ankles.  Sensation in tact to light touch and pin prick of the bilateral LE's.  Gait antalgic secondary to pain  Skin: Skin is warm and dry. No rash noted. He is not diaphoretic.    ED Course  Procedures (including critical care time) Labs  Review Labs Reviewed - No data to display  Imaging Review Dg Cervical Spine Complete  12/13/2014   CLINICAL DATA:  Neck and back pain after falling on an IC porch this morning.  EXAM: CERVICAL SPINE  4+ VIEWS  COMPARISON:  None.  FINDINGS: There is no evidence of cervical spine fracture. The prevertebral soft tissues appear unremarkable. There is moderate cervicothoracic curvature, left convex centered at T2. No significant spondylosis is evident.  IMPRESSION: Negative for acute fracture   Electronically Signed   By: Ellery Plunk M.D.   On: 12/13/2014 14:06   Dg Lumbar Spine Complete  12/13/2014   CLINICAL DATA:  Neck and back pain after falling today. Initial encounter.  EXAM: LUMBAR SPINE - COMPLETE 4+ VIEW  COMPARISON:  Lumbar spine radiographs 04/11/2014.  FINDINGS: There are 5 lumbar type vertebral bodies. The alignment is normal. The disc spaces are preserved. There is no evidence of acute fracture or pars defect.  IMPRESSION: Stable unremarkable lumbar spine radiographs. No acute osseous findings.   Electronically Signed   By: Roxy Horseman M.D.   On: 12/13/2014 14:06      MDM   Final diagnoses:  Fall  Contusion of back, unspecified laterality, initial encounter  Contusion of neck, initial encounter    The patient has injuries to his back and neck, check x-rays, likely muscle strain and contusion, pain medications given as below.  Imaging neg, stable for dc  Meds given in ED:  Medications  HYDROcodone-acetaminophen (NORCO/VICODIN) 5-325 MG per tablet 2 tablet (not administered)  ketorolac (TORADOL) injection 60 mg (60 mg Intramuscular Given 12/13/14 1346)    New Prescriptions   METHOCARBAMOL (ROBAXIN) 500 MG TABLET    Take 1 tablet (500 mg total) by mouth 2 (two) times daily as needed for muscle spasms.   NAPROXEN (NAPROSYN) 500 MG TABLET    Take 1 tablet (500 mg total) by mouth 2 (two) times daily with a meal.        Vida Roller, MD 12/13/14 1420

## 2014-12-13 NOTE — Discharge Instructions (Signed)
Please call your doctor for a followup appointment within 24-48 hours. When you talk to your doctor please let them know that you were seen in the emergency department and have them acquire all of your records so that they can discuss the findings with you and formulate a treatment plan to fully care for your new and ongoing problems. ° °[facility] Doctor List ° ° ° °Edward Hawkins MD. Specialty: Pulmonary Disease Contact information: [address]  °[address]  °[place] [address]  °[phone]  ° °Margaret Simpson, MD. Specialty: Family Medicine Contact information: [address]  °[place] [address]  °[phone]  ° °Scott Luking, MD. Specialty: Family Medicine Contact information: [address]  °[address]  °[place] [address]  °[phone]  ° °Tesfaye Fanta, MD Specialty: Internal Medicine Contact information: [address]  °[place] [address]  °[phone]  ° °Zach Hall, MD. Specialty: Internal Medicine Contact information: [address]  °[place] [address]  °[phone]  ° °Angus Mcinnis, MD. Specialty: Family Medicine Contact information: [address]  °[place] [address]  °[phone]  ° °Stephen Knowlton, MD. Specialty: Family Medicine Contact information: [address]  °[address]  °[place] [address]  °[phone]  ° °Roy Fagan, MD. Specialty: Internal Medicine Contact information: [address]  °[address]  °[place] [address]  °[phone]  ° ° °

## 2014-12-13 NOTE — ED Notes (Signed)
Patient given discharge instruction, verbalized understand. Patient ambulatory out of the department.  

## 2014-12-24 ENCOUNTER — Ambulatory Visit: Payer: Medicaid Other | Admitting: Gastroenterology

## 2015-01-12 ENCOUNTER — Emergency Department: Payer: Self-pay | Admitting: Emergency Medicine

## 2015-01-13 ENCOUNTER — Encounter: Payer: Self-pay | Admitting: Gastroenterology

## 2015-01-13 ENCOUNTER — Ambulatory Visit: Payer: Self-pay | Admitting: Gastroenterology

## 2015-01-13 ENCOUNTER — Telehealth: Payer: Self-pay | Admitting: Gastroenterology

## 2015-01-13 NOTE — Telephone Encounter (Signed)
PATIENT WAS A NO SHOW 01/13/15 AND LETTER SENT

## 2015-08-26 ENCOUNTER — Emergency Department (HOSPITAL_COMMUNITY)
Admission: EM | Admit: 2015-08-26 | Discharge: 2015-08-26 | Discharge status: Against Medical Advice | Payer: Medicaid Other | Attending: Emergency Medicine | Admitting: Emergency Medicine

## 2015-08-26 ENCOUNTER — Encounter (HOSPITAL_COMMUNITY): Payer: Self-pay | Admitting: Emergency Medicine

## 2015-08-26 DIAGNOSIS — Z79899 Other long term (current) drug therapy: Secondary | ICD-10-CM | POA: Diagnosis not present

## 2015-08-26 DIAGNOSIS — Z79891 Long term (current) use of opiate analgesic: Secondary | ICD-10-CM | POA: Insufficient documentation

## 2015-08-26 DIAGNOSIS — Z791 Long term (current) use of non-steroidal anti-inflammatories (NSAID): Secondary | ICD-10-CM | POA: Diagnosis not present

## 2015-08-26 DIAGNOSIS — Z720 Tobacco use: Secondary | ICD-10-CM | POA: Insufficient documentation

## 2015-08-26 DIAGNOSIS — G8929 Other chronic pain: Secondary | ICD-10-CM | POA: Diagnosis not present

## 2015-08-26 DIAGNOSIS — M79621 Pain in right upper arm: Secondary | ICD-10-CM | POA: Diagnosis present

## 2015-08-26 DIAGNOSIS — I10 Essential (primary) hypertension: Secondary | ICD-10-CM | POA: Diagnosis not present

## 2015-08-26 DIAGNOSIS — Z8719 Personal history of other diseases of the digestive system: Secondary | ICD-10-CM | POA: Insufficient documentation

## 2015-08-26 DIAGNOSIS — L02411 Cutaneous abscess of right axilla: Secondary | ICD-10-CM

## 2015-08-26 MED ORDER — IBUPROFEN 800 MG PO TABS: 800.0000 mg | ORAL_TABLET | Freq: Once | ORAL | Status: DC

## 2015-08-26 MED ORDER — IBUPROFEN 800 MG PO TABS
ORAL_TABLET | ORAL | Status: AC
  Filled 2015-08-26: qty 1

## 2015-08-26 MED ORDER — LIDOCAINE-EPINEPHRINE-TETRACAINE (LET) SOLUTION
3.0000 mL | Freq: Once | NASAL | Status: AC
  Administered 2015-08-26: 3 mL via TOPICAL
  Filled 2015-08-26: qty 3

## 2015-08-26 NOTE — ED Notes (Signed)
Pt requesting something for pain and Hope had spoke with pt.  Hope stated that pt had told her that he took suboxone and not able to take anything else other than dilaudid.  Hope refuses to order dilaudid for pain and LET ordered and placed on site.  Ibuprofen 800 mg offered and pt refused.  Pt became upset and stated that he was going to leave and go somewhere else. "I not gonna let her cut on me without getting something for pain".  Hope notified of pt leaving

## 2015-08-26 NOTE — ED Notes (Signed)
Patient complaining of "insect bite" under right arm x 2 days. Complaining of pain to right axillary area. Noted redness and swelling to area.

## 2015-08-26 NOTE — ED Provider Notes (Signed)
CSN: [id number]     Arrival date & time 08/26/15  1530 History   First MD Initiated Contact with Patient 08/26/15 1603     Chief Complaint  Patient presents with  . Insect Bite     (Consider location/radiation/quality/duration/timing/severity/associated sxs/prior Treatment) HPI Jeff Nash is a 39 y.o. male who presents to the ED with what he thought may be an insect bite to the right axilla. The symptoms started 2 days ago with just a small area that was raised and a little tender. Over the past 2 days the area has worsened and there is swelling, redness and pain. Patient denies fever or chills. He has not taken anything for pain. He is requesting pain medication and states that he is on Suboxone and the only pain medication he can take with it is Dilaudid.   Past Medical History  Diagnosis Date  . Dental decay   . Chronic leg pain   . Hypertension    History reviewed. No pertinent past surgical history. Family History  Problem Relation Age of Onset  . Diabetes Mother   . Diabetes Father    Social History  Substance Use Topics  . Smoking status: Current Every Day Smoker -- 0.50 packs/day for 20 years    Types: Cigarettes  . Smokeless tobacco: Never Used  . Alcohol Use: Yes     Comment: Occ    Review of Systems Negative except as stated in HPI   Allergies  Tramadol  Home Medications   Prior to Admission medications   Medication Sig Start Date End Date Taking? Authorizing Provider  albuterol (PROVENTIL HFA;VENTOLIN HFA) 108 (90 BASE) MCG/ACT inhaler Inhale 1-2 puffs into the lungs every 6 (six) hours as needed for wheezing or shortness of breath. 11/04/13   Burgess Amor, PA-C  ibuprofen (ADVIL,MOTRIN) 200 MG tablet Take 800 mg by mouth every 6 (six) hours as needed for headache.    Historical Provider, MD  ibuprofen (ADVIL,MOTRIN) 600 MG tablet Take 1 tablet (600 mg total) by mouth every 6 (six) hours as needed. Patient not taking: Reported on 12/13/2014 05/13/14   Burgess Amor, PA-C  methocarbamol (ROBAXIN) 500 MG tablet Take 1 tablet (500 mg total) by mouth 2 (two) times daily as needed for muscle spasms. 12/13/14   Eber Hong, MD  naproxen (NAPROSYN) 500 MG tablet Take 1 tablet (500 mg total) by mouth 2 (two) times daily with a meal. 12/13/14   Eber Hong, MD  SUBOXONE 8-2 MG FILM Take 1 Film by mouth 3 (three) times daily. 11/01/14   Historical Provider, MD   BP 122/73 mmHg  Pulse 66  Temp(Src) 97.6 F (36.4 C) (Oral)  Resp 18  Ht  (1.753 m)  Wt 175 lb (79.379 kg)  BMI 25.83 kg/m2  SpO2 100% Physical Exam  Constitutional: He is oriented to person, place, and time. He appears well-developed and well-nourished. No distress.  HENT:  Head: Normocephalic and atraumatic.  Eyes: Conjunctivae and EOM are normal.  Neck: Normal range of motion. Neck supple.  Cardiovascular: Normal rate.   Pulmonary/Chest: Effort normal.  Musculoskeletal: Normal range of motion.  Right axilla with raised, tender area with erythema. There is a pustular center. No red streaking.  Neurological: He is alert and oriented to person, place, and time. No cranial nerve deficit.  Skin: Skin is warm and dry.  Psychiatric: He has a normal mood and affect. His behavior is normal.  Nursing note and vitals reviewed.   ED Course  Procedures (  including critical care time) Patient request I&D of the area and request pain medication.   RN in to apply LET,explained to the patient that we could give ibuprofen and use lidocaine after the LET has started working. Patient upset and states he should be able to get Morphine or Dilaudid prior to having the abscess drained since he is on Suboxone and can not take Percocet or Hydrocodone and is allergic to Tramadol.    MDM  39 y.o. male complaining of pain and redness to right axilla, patient left AMA because he was not given an injection of Dilaudid or Morphine for his pain. States he will go somewhere else where they will treat his pain.    Final diagnoses:  Cutaneous abscess of right axilla        Janne Napoleon, NP 08/26/15 1639  Benjiman Core, MD 08/26/15 [phone]

## 2015-08-30 ENCOUNTER — Emergency Department
Admission: EM | Admit: 2015-08-30 | Discharge: 2015-08-30 | Disposition: A | Discharge status: Home / Self Care | Payer: Medicaid Other | Attending: Emergency Medicine | Admitting: Emergency Medicine

## 2015-08-30 ENCOUNTER — Encounter: Payer: Self-pay | Admitting: Emergency Medicine

## 2015-08-30 DIAGNOSIS — Z791 Long term (current) use of non-steroidal anti-inflammatories (NSAID): Secondary | ICD-10-CM | POA: Insufficient documentation

## 2015-08-30 DIAGNOSIS — I10 Essential (primary) hypertension: Secondary | ICD-10-CM | POA: Diagnosis not present

## 2015-08-30 DIAGNOSIS — Z79899 Other long term (current) drug therapy: Secondary | ICD-10-CM | POA: Insufficient documentation

## 2015-08-30 DIAGNOSIS — L0291 Cutaneous abscess, unspecified: Secondary | ICD-10-CM

## 2015-08-30 DIAGNOSIS — Z720 Tobacco use: Secondary | ICD-10-CM | POA: Diagnosis not present

## 2015-08-30 DIAGNOSIS — L02411 Cutaneous abscess of right axilla: Secondary | ICD-10-CM | POA: Diagnosis present

## 2015-08-30 MED ORDER — LIDOCAINE-EPINEPHRINE (PF) 1 %-1:200000 IJ SOLN
INTRAMUSCULAR | Status: AC
  Filled 2015-08-30: qty 30

## 2015-08-30 NOTE — ED Notes (Signed)
Pt reports abscess to right underarm area x1 week.

## 2015-08-30 NOTE — ED Provider Notes (Signed)
[facility] Emergency Department Provider Note  ____________________________________________  Time seen: On arrival  I have reviewed the triage vital signs and the nursing notes.   HISTORY  Chief Complaint Abscess    HPI Jeff Nash is a 39 y.o. male who presents with an abscess to the right axilla. He has never had this before. He reports he was seen several days ago but they do not want to drain at that time. He denies fevers chills. No nausea no vomiting. No injury to the area.    Past Medical History  Diagnosis Date  . Dental decay   . Chronic leg pain   . Hypertension     There are no active problems to display for this patient.   History reviewed. No pertinent past surgical history.  Current Outpatient Rx  Name  Route  Sig  Dispense  Refill  . albuterol (PROVENTIL HFA;VENTOLIN HFA) 108 (90 BASE) MCG/ACT inhaler   Inhalation   Inhale 1-2 puffs into the lungs every 6 (six) hours as needed for wheezing or shortness of breath.   1 Inhaler   0   . ibuprofen (ADVIL,MOTRIN) 200 MG tablet   Oral   Take 800 mg by mouth every 6 (six) hours as needed for headache.         . ibuprofen (ADVIL,MOTRIN) 600 MG tablet   Oral   Take 1 tablet (600 mg total) by mouth every 6 (six) hours as needed. Patient not taking: Reported on 12/13/2014   30 tablet   0   . methocarbamol (ROBAXIN) 500 MG tablet   Oral   Take 1 tablet (500 mg total) by mouth 2 (two) times daily as needed for muscle spasms.   20 tablet   0   . naproxen (NAPROSYN) 500 MG tablet   Oral   Take 1 tablet (500 mg total) by mouth 2 (two) times daily with a meal.   30 tablet   0   . SUBOXONE 8-2 MG FILM   Oral   Take 1 Film by mouth 3 (three) times daily.      0     Allergies Tramadol  Family History  Problem Relation Age of Onset  . Diabetes Mother   . Diabetes Father     Social History Social History  Substance Use Topics  . Smoking status: Current Every Day  Smoker -- 0.50 packs/day for 20 years    Types: Cigarettes  . Smokeless tobacco: Never Used  . Alcohol Use: Yes     Comment: Occ    Review of Systems  Constitutional: Negative for fever.  Abdominal: Negative for nausea  Musculoskeletal: Negative for back pain. Skin: Negative for rash. Positive for abscess Neurological: Negative for headaches    ____________________________________________   PHYSICAL EXAM:  VITAL SIGNS: ED Triage Vitals  Enc Vitals Group     BP 08/30/15 1548 135/94 mmHg     Pulse Rate 08/30/15 1548 74     Resp 08/30/15 1548 16     Temp 08/30/15 1548 97.7 F (36.5 C)     Temp Source 08/30/15 1548 Oral     SpO2 08/30/15 1548 99 %     Weight 08/30/15 1548 175 lb (79.379 kg)     Height 08/30/15 1548  (1.753 m)     Head Cir --      Peak Flow --      Pain Score 08/30/15 1548 10     Pain Loc --  Pain Edu? --      Excl. in GC? --      Constitutional: Alert and oriented. Well appearing and in no distress. Eyes: Conjunctivae are normal.  ENT   Head: Normocephalic and atraumatic.   Mouth/Throat: Mucous membranes are moist.  Respiratory: Normal respiratory effort without tachypnea nor retractions.  Gastrointestinal: Soft and non-tender in all quadrants. No distention. There is no CVA tenderness. Musculoskeletal: Nontender with normal range of motion in all extremities. Neurologic:  Normal speech and language. No gross focal neurologic deficits are appreciated. Skin:  Skin is warm, dry and intact. Abscess in right axilla approximately 3 x 2 cm with some drainage that is purulent Psychiatric: Mood and affect are normal. Patient exhibits appropriate insight and judgment.  ____________________________________________    LABS (pertinent positives/negatives)  Labs Reviewed - No data to display  ____________________________________________     ____________________________________________    RADIOLOGY I have personally reviewed any  xrays that were ordered on this patient: None  ____________________________________________   PROCEDURES  Procedure(s) performed: yes  INCISION AND DRAINAGE Performed by: Jene Every Consent: Verbal consent obtained. Risks and benefits: risks, benefits and alternatives were discussed Type: abscess  Body area: Right axilla  Anesthesia: local infiltration  Incision was made with a scalpel.  Local anesthetic: lidocaine 1% % with epinephrine  Anesthetic total: 3 ml  Complexity: Simple   Drainage: purulent  Drainage amount: Moderate   Packing material: 1/4 in iodoform gauze attempted but patient would not tolerate   Patient tolerance: Patient tolerated the procedure  with no immediate complications.      ____________________________________________   INITIAL IMPRESSION / ASSESSMENT AND PLAN / ED COURSE  Pertinent labs & imaging results that were available during my care of the patient were reviewed by me and considered in my medical decision making (see chart for details).  Patient with right axillary abscess, I&D by me. He would not tolerate packing. He has antibiotics for an abscess that he has yet to pick up  ____________________________________________   FINAL CLINICAL IMPRESSION(S) / ED DIAGNOSES  Final diagnoses:  Abscess     Jene Every, MD 08/30/15 1626

## 2015-08-30 NOTE — ED Notes (Signed)
Lido pulled for MD kinner at this time and given for administration during abscess drainage.

## 2015-08-30 NOTE — ED Notes (Signed)
MD kinner at bedside. 

## 2015-08-30 NOTE — Discharge Instructions (Signed)

## 2015-10-09 ENCOUNTER — Emergency Department: Payer: Medicaid Other

## 2015-10-09 ENCOUNTER — Emergency Department
Admission: EM | Admit: 2015-10-09 | Discharge: 2015-10-09 | Disposition: A | Discharge status: Home / Self Care | Payer: Medicaid Other | Attending: Emergency Medicine | Admitting: Emergency Medicine

## 2015-10-09 ENCOUNTER — Encounter: Payer: Self-pay | Admitting: *Deleted

## 2015-10-09 DIAGNOSIS — Y998 Other external cause status: Secondary | ICD-10-CM | POA: Diagnosis not present

## 2015-10-09 DIAGNOSIS — F17210 Nicotine dependence, cigarettes, uncomplicated: Secondary | ICD-10-CM | POA: Diagnosis not present

## 2015-10-09 DIAGNOSIS — I10 Essential (primary) hypertension: Secondary | ICD-10-CM | POA: Diagnosis not present

## 2015-10-09 DIAGNOSIS — Y9389 Activity, other specified: Secondary | ICD-10-CM | POA: Diagnosis not present

## 2015-10-09 DIAGNOSIS — M549 Dorsalgia, unspecified: Secondary | ICD-10-CM

## 2015-10-09 DIAGNOSIS — W19XXXA Unspecified fall, initial encounter: Secondary | ICD-10-CM

## 2015-10-09 DIAGNOSIS — W01198A Fall on same level from slipping, tripping and stumbling with subsequent striking against other object, initial encounter: Secondary | ICD-10-CM | POA: Diagnosis not present

## 2015-10-09 DIAGNOSIS — S299XXA Unspecified injury of thorax, initial encounter: Secondary | ICD-10-CM | POA: Diagnosis not present

## 2015-10-09 DIAGNOSIS — S39012A Strain of muscle, fascia and tendon of lower back, initial encounter: Secondary | ICD-10-CM | POA: Insufficient documentation

## 2015-10-09 DIAGNOSIS — Y9289 Other specified places as the place of occurrence of the external cause: Secondary | ICD-10-CM | POA: Insufficient documentation

## 2015-10-09 DIAGNOSIS — R079 Chest pain, unspecified: Secondary | ICD-10-CM

## 2015-10-09 LAB — BASIC METABOLIC PANEL
ANION GAP: 5 (ref 5–15)
BUN: 13 mg/dL (ref 6–20)
CO2: 31 mmol/L (ref 22–32)
Calcium: 9.1 mg/dL (ref 8.9–10.3)
Chloride: 104 mmol/L (ref 101–111)
Creatinine, Ser: 1.04 mg/dL (ref 0.61–1.24)
GFR calc Af Amer: >60 mL/min (ref >60)
GFR calc non Af Amer: >60 mL/min (ref >60)
GLUCOSE: 96 mg/dL (ref 65–99)
POTASSIUM: 4.2 mmol/L (ref 3.5–5.1)
Sodium: 140 mmol/L (ref 135–145)

## 2015-10-09 LAB — CBC
HCT: 48.2 % (ref 40.0–52.0)
Hemoglobin: 16.4 g/dL (ref 13.0–18.0)
MCH: 32.1 pg (ref 26.0–34.0)
MCHC: 34 g/dL (ref 32.0–36.0)
MCV: 94.6 fL (ref 80.0–100.0)
Platelets: 191 10*3/uL (ref 150–440)
RBC: 5.1 MIL/uL (ref 4.40–5.90)
RDW: 13 % (ref 11.5–14.5)
WBC: 7.9 10*3/uL (ref 3.8–10.6)

## 2015-10-09 LAB — TROPONIN I: Troponin I: <0.03 ng/mL (ref <0.031)

## 2015-10-09 MED ORDER — IBUPROFEN 800 MG PO TABS
ORAL_TABLET | ORAL | Status: AC
Start: 2015-10-09 — End: 2015-10-09
  Administered 2015-10-09: 800 mg via ORAL
  Filled 2015-10-09: qty 1

## 2015-10-09 MED ORDER — DIAZEPAM 5 MG PO TABS: 5.0000 mg | ORAL_TABLET | Freq: Three times a day (TID) | ORAL | Status: AC | PRN

## 2015-10-09 MED ORDER — CYCLOBENZAPRINE HCL 10 MG PO TABS: 10.0000 mg | ORAL_TABLET | Freq: Three times a day (TID) | ORAL | Status: AC | PRN

## 2015-10-09 MED ORDER — IBUPROFEN 800 MG PO TABS: 800.0000 mg | ORAL_TABLET | Freq: Three times a day (TID) | ORAL | Status: AC | PRN

## 2015-10-09 MED ORDER — OXYCODONE-ACETAMINOPHEN 5-325 MG PO TABS
ORAL_TABLET | ORAL | Status: AC
  Administered 2015-10-09: 2 via ORAL
  Filled 2015-10-09: qty 2

## 2015-10-09 MED ORDER — IBUPROFEN 800 MG PO TABS
800.0000 mg | ORAL_TABLET | Freq: Once | ORAL | Status: AC
  Administered 2015-10-09: 800 mg via ORAL

## 2015-10-09 MED ORDER — OXYCODONE-ACETAMINOPHEN 5-325 MG PO TABS
2.0000 | ORAL_TABLET | Freq: Once | ORAL | Status: AC
Start: 2015-10-09 — End: 2015-10-09
  Administered 2015-10-09: 2 via ORAL

## 2015-10-09 NOTE — Discharge Instructions (Signed)
Back Pain, Adult °Back pain is very common in adults. The cause of back pain is rarely dangerous and the pain often gets better over time. The cause of your back pain may not be known. Some common causes of back pain include: °· Strain of the muscles or ligaments supporting the spine. °· Wear and tear (degeneration) of the spinal disks. °· Arthritis. °· Direct injury to the back. °For many people, back pain may return. Since back pain is rarely dangerous, most people can learn to manage this condition on their own. °HOME CARE INSTRUCTIONS °Watch your back pain for any changes. The following actions may help to lessen any discomfort you are feeling: °· Remain active. It is stressful on your back to sit or stand in one place for long periods of time. Do not sit, drive, or stand in one place for more than 30 minutes at a time. Take short walks on even surfaces as soon as you are able. Try to increase the length of time you walk each day. °· Exercise regularly as directed by your health care provider. Exercise helps your back heal faster. It also helps avoid future injury by keeping your muscles strong and flexible. °· Do not stay in bed. Resting more than 1-2 days can delay your recovery. °· Pay attention to your body when you bend and lift. The most comfortable positions are those that put less stress on your recovering back. Always use proper lifting techniques, including: °· Bending your knees. °· Keeping the load close to your body. °· Avoiding twisting. °· Find a comfortable position to sleep. Use a firm mattress and lie on your side with your knees slightly bent. If you lie on your back, put a pillow under your knees. °· Avoid feeling anxious or stressed. Stress increases muscle tension and can worsen back pain. It is important to recognize when you are anxious or stressed and learn ways to manage it, such as with exercise. °· Take medicines only as directed by your health care provider. Over-the-counter  medicines to reduce pain and inflammation are often the most helpful. Your health care provider may prescribe muscle relaxant drugs. These medicines help dull your pain so you can more quickly return to your normal activities and healthy exercise. °· Apply ice to the injured area: °· Put ice in a plastic bag. °· Place a towel between your skin and the bag. °· Leave the ice on for 20 minutes, 2-3 times a day for the first 2-3 days. After that, ice and heat may be alternated to reduce pain and spasms. °· Maintain a healthy weight. Excess weight puts extra stress on your back and makes it difficult to maintain good posture. °SEEK MEDICAL CARE IF: °· You have pain that is not relieved with rest or medicine. °· You have increasing pain going down into the legs or buttocks. °· You have pain that does not improve in one week. °· You have night pain. °· You lose weight. °· You have a fever or chills. °SEEK IMMEDIATE MEDICAL CARE IF:  °· You develop new bowel or bladder control problems. °· You have unusual weakness or numbness in your arms or legs. °· You develop nausea or vomiting. °· You develop abdominal pain. °· You feel faint. °  °This information is not intended to replace advice given to you by your health care provider. Make sure you discuss any questions you have with your health care provider. °  °Document Released: 11/07/2005 Document Revised: 11/28/2014 Document Reviewed: 03/11/2014 °Elsevier Interactive Patient Education ©2016 [employer] ° °Nonspecific Chest Pain  °Chest pain can be caused by many different conditions. There is always a chance that your pain could be related to something serious, such as a heart attack or a blood clot in your lungs. Chest pain can also be caused by conditions that are not life-threatening. If you have chest pain, it is very important to follow up with your health care provider. °CAUSES  °Chest pain can be caused by: °· Heartburn. °· Pneumonia or bronchitis. °· Anxiety or  stress. °· Inflammation around your heart (pericarditis) or lung (pleuritis or pleurisy). °· A blood clot in your lung. °· A collapsed lung (pneumothorax). It can develop suddenly on its own (spontaneous pneumothorax) or from trauma to the chest. °· Shingles infection (varicella-zoster virus). °· Heart attack. °· Damage to the bones, muscles, and cartilage that make up your chest wall. This can include: °¨ Bruised bones due to injury. °¨ Strained muscles or cartilage due to frequent or repeated coughing or overwork. °¨ Fracture to one or more ribs. °¨ Sore cartilage due to inflammation (costochondritis). °RISK FACTORS  °Risk factors for chest pain may include: °· Activities that increase your risk for trauma or injury to your chest. °· Respiratory infections or conditions that cause frequent coughing. °· Medical conditions or overeating that can cause heartburn. °· Heart disease or family history of heart disease. °· Conditions or health behaviors that increase your risk of developing a blood clot. °· Having had chicken pox (varicella zoster). °SIGNS AND SYMPTOMS °Chest pain can feel like: °· Burning or tingling on the surface of your chest or deep in your chest. °· Crushing, pressure, aching, or squeezing pain. °· Dull or sharp pain that is worse when you move, cough, or take a deep breath. °· Pain that is also felt in your back, neck, shoulder, or arm, or pain that spreads to any of these areas. °Your chest pain may come and go, or it may stay constant. °DIAGNOSIS °Lab tests or other studies may be needed to find the cause of your pain. Your health care provider may have you take a test called an ambulatory ECG (electrocardiogram). An ECG records your heartbeat patterns at the time the test is performed. You may also have other tests, such as: °· Transthoracic echocardiogram (TTE). During echocardiography, sound waves are used to create a picture of all of the heart structures and to look at how blood flows  through your heart. °· Transesophageal echocardiogram (TEE). This is a more advanced imaging test that obtains images from inside your body. It allows your health care provider to see your heart in finer detail. °· Cardiac monitoring. This allows your health care provider to monitor your heart rate and rhythm in real time. °· Holter monitor. This is a portable device that records your heartbeat and can help to diagnose abnormal heartbeats. It allows your health care provider to track your heart activity for several days, if needed. °· Stress tests. These can be done through exercise or by taking medicine that makes your heart beat more quickly. °· Blood tests. °· Imaging tests. °TREATMENT  °Your treatment depends on what is causing your chest pain. Treatment may include: °· Medicines. These may include: °¨ Acid blockers for heartburn. °¨ Anti-inflammatory medicine. °¨ Pain medicine for inflammatory conditions. °¨ Antibiotic medicine, if an infection is present. °¨ Medicines to dissolve blood clots. °¨ Medicines to treat coronary artery disease. °· Supportive care for conditions that do not require medicines. This may include: °¨ Resting. °¨   Applying heat or cold packs to injured areas.  Limiting activities until pain decreases. HOME CARE INSTRUCTIONS  If you were prescribed an antibiotic medicine, finish it all even if you start to feel better.  Avoid any activities that bring on chest pain.  Do not use any tobacco products, including cigarettes, chewing tobacco, or electronic cigarettes. If you need help quitting, ask your health care provider.  Do not drink alcohol.  Take medicines only as directed by your health care provider.  Keep all follow-up visits as directed by your health care provider. This is important. This includes any further testing if your chest pain does not go away.  If heartburn is the cause for your chest pain, you may be told to keep your head raised (elevated) while sleeping.  This reduces the chance that acid will go from your stomach into your esophagus.  Make lifestyle changes as directed by your health care provider. These may include:  Getting regular exercise. Ask your health care provider to suggest some activities that are safe for you.  Eating a heart-healthy diet. A registered dietitian can help you to learn healthy eating options.  Maintaining a healthy weight.  Managing diabetes, if necessary.  Reducing stress. SEEK MEDICAL CARE IF:  Your chest pain does not go away after treatment.  You have a rash with blisters on your chest.  You have a fever. SEEK IMMEDIATE MEDICAL CARE IF:   Your chest pain is worse.  You have an increasing cough, or you cough up blood.  You have severe abdominal pain.  You have severe weakness.  You faint.  You have chills.  You have sudden, unexplained chest discomfort.  You have sudden, unexplained discomfort in your arms, back, neck, or jaw.  You have shortness of breath at any time.  You suddenly start to sweat, or your skin gets clammy.  You feel nauseous or you vomit.  You suddenly feel light-headed or dizzy.  Your heart begins to beat quickly, or it feels like it is skipping beats. These symptoms may represent a serious problem that is an emergency. Do not wait to see if the symptoms will go away. Get medical help right away. Call your local emergency services (911 in the [place] Do not drive yourself to the hospital.   This information is not intended to replace advice given to you by your health care provider. Make sure you discuss any questions you have with your health care provider.   Document Released: 08/17/2005 Document Revised: 11/28/2014 Document Reviewed: 06/13/2014 Elsevier Interactive Patient Education 2016 [employer] Inc[employer]  Musculoskeletal Pain Musculoskeletal pain is muscle and boney aches and pains. These pains can occur in any part of the body. Your caregiver may treat you  without knowing the cause of the pain. They may treat you if blood or urine tests, X-rays, and other tests were normal.  CAUSES There is often not a definite cause or reason for these pains. These pains may be caused by a type of germ (virus). The discomfort may also come from overuse. Overuse includes working out too hard when your body is not fit. Boney aches also come from weather changes. Bone is sensitive to atmospheric pressure changes. HOME CARE INSTRUCTIONS   Ask when your test results will be ready. Make sure you get your test results.  Only take over-the-counter or prescription medicines for pain, discomfort, or fever as directed by your caregiver. If you were given medications for your condition, do not drive, operate machinery or  power tools, or sign legal documents for 24 hours. Do not drink alcohol. Do not take sleeping pills or other medications that may interfere with treatment.  Continue all activities unless the activities cause more pain. When the pain lessens, slowly resume normal activities. Gradually increase the intensity and duration of the activities or exercise.  During periods of severe pain, bed rest may be helpful. Lay or sit in any position that is comfortable.  Putting ice on the injured area.  Put ice in a bag.  Place a towel between your skin and the bag.  Leave the ice on for 15 to 20 minutes, 3 to 4 times a day.  Follow up with your caregiver for continued problems and no reason can be found for the pain. If the pain becomes worse or does not go away, it may be necessary to repeat tests or do additional testing. Your caregiver may need to look further for a possible cause. SEEK IMMEDIATE MEDICAL CARE IF:  You have pain that is getting worse and is not relieved by medications.  You develop chest pain that is associated with shortness or breath, sweating, feeling sick to your stomach (nauseous), or throw up (vomit).  Your pain becomes localized to the  abdomen.  You develop any new symptoms that seem different or that concern you. MAKE SURE YOU:   Understand these instructions.  Will watch your condition.  Will get help right away if you are not doing well or get worse.   This information is not intended to replace advice given to you by your health care provider. Make sure you discuss any questions you have with your health care provider.   Document Released: 11/07/2005 Document Revised: 01/30/2012 Document Reviewed: 07/12/2013 Elsevier Interactive Patient Education [employer]

## 2015-10-09 NOTE — ED Notes (Signed)
Pt fell last night states he hit his back, pt c/o chest pain at this time nadn at this time a&ox4

## 2015-10-09 NOTE — ED Notes (Signed)
Pt fell off back porch last night injured back, pt denies hitting head or LOC, pt started experiencing chest pain today

## 2015-10-09 NOTE — ED Provider Notes (Signed)
[facility] Emergency Department Provider Note     Time seen: ----------------------------------------- 8:33 PM on 10/09/2015 -----------------------------------------    I have reviewed the triage vital signs and the nursing notes.   HISTORY  Chief Complaint Fall; Back Pain; and Chest Pain    HPI Jeff Nash is a 39 y.o. male who presents ER after he fell last night and states he hit his back. He also points of chest pain at this time, he denies hitting his head or had loss of consciousness when he fell. Patient has a history of chronic pain, nothing makes his symptoms better or worse. She denies any radicular component and his back pain, denies any loss of bowel or bladder function.   Past Medical History  Diagnosis Date  . Dental decay   . Chronic leg pain   . Hypertension     There are no active problems to display for this patient.   History reviewed. No pertinent past surgical history.  Allergies Tramadol  Social History Social History  Substance Use Topics  . Smoking status: Current Every Day Smoker -- 0.50 packs/day for 20 years    Types: Cigarettes  . Smokeless tobacco: Never Used  . Alcohol Use: No     Comment: Occ    Review of Systems Constitutional: Negative for fever. Eyes: Negative for visual changes. ENT: Negative for sore throat. Cardiovascular: Positive for chest pain Respiratory: Negative for shortness of breath. Gastrointestinal: Negative for abdominal pain, vomiting and diarrhea. Genitourinary: Negative for dysuria. Musculoskeletal: Positive for back pain Skin: Negative for rash. Neurological: Negative for headaches, focal weakness or numbness.  10-point ROS otherwise negative.  ____________________________________________   PHYSICAL EXAM:  VITAL SIGNS: ED Triage Vitals  Enc Vitals Group     BP 10/09/15 1846 101/60 mmHg     Pulse Rate 10/09/15 1846 71     Resp 10/09/15 1846 20     Temp 10/09/15 1846  97.8 F (36.6 C)     Temp Source 10/09/15 1846 Oral     SpO2 10/09/15 1846 96 %     Weight 10/09/15 1846 180 lb (81.647 kg)     Height 10/09/15 1846 5\' 9"  (1.753 m)     Head Cir --      Peak Flow --      Pain Score 10/09/15 1852 10     Pain Loc --      Pain Edu? --      Excl. in GC? --     Constitutional: Alert and oriented. Well appearing and in no distress. Eyes: Conjunctivae are normal. PERRL. Normal extraocular movements. ENT   Head: Normocephalic and atraumatic.   Nose: No congestion/rhinnorhea.   Mouth/Throat: Mucous membranes are moist.   Neck: No stridor. Cardiovascular: Normal rate, regular rhythm. Normal and symmetric distal pulses are present in all extremities. No murmurs, rubs, or gallops. Respiratory: Normal respiratory effort without tachypnea nor retractions. Breath sounds are clear and equal bilaterally. No wheezes/rales/rhonchi. Gastrointestinal: Soft and nontender. No distention. No abdominal bruits.  Musculoskeletal: Decreased range of motion of the back with diffuse tenderness. Negative cross straight leg raise examination.  Neurologic:  Normal speech and language. No gross focal neurologic deficits are appreciated. Speech is normal. No gait instability. Skin:  Skin is warm, dry and intact. No rash noted. Psychiatric: Mood and affect are normal. Speech and behavior are normal. Patient exhibits appropriate insight and judgment. ____________________________________________  EKG: Interpreted by me. Normal sinus rhythm with normal axis normal intervals. No evidence  of hypertrophy or acute infarction. Rate is 73 bpm  ____________________________________________  ED COURSE:  Pertinent labs & imaging results that were available during my care of the patient were reviewed by me and considered in my medical decision making (see chart for details). Patient is in no acute distress, will check basic labs and  x-rays. ____________________________________________    LABS (pertinent positives/negatives)  Labs Reviewed  BASIC METABOLIC PANEL  CBC  TROPONIN I    RADIOLOGY  Chest x-ray, LS-spine X-rays are unremarkable ____________________________________________  FINAL ASSESSMENT AND PLAN  Fall, lumbosacral strain, chest pain  Plan: Patient with labs and imaging as dictated above. Patient's workup here is negative. Will be discharged with Motrin and Flexeril to take as needed.   Emily Filbert, MD   Emily Filbert, MD 10/09/15 [phone]

## 2015-12-01 IMAGING — US US ABDOMEN COMPLETE
1 series · 14 of 25 positions shown · non-contrast
Comparison: None.

CLINICAL DATA: Mid abdominal pain since yesterday. Nausea,
vomiting.

EXAM:
ULTRASOUND ABDOMEN COMPLETE

[Series 1: us abdomen complete · 0.21mm/px · 14 of 99 slices shown]
[im 1/99]
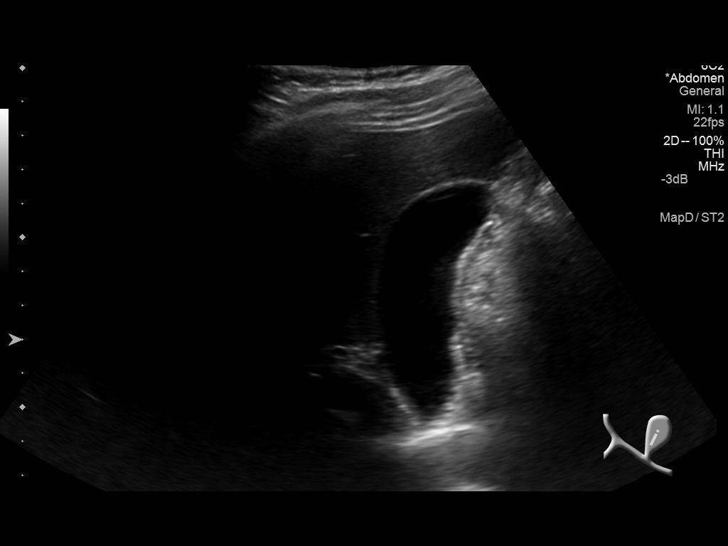
[im 9/99]
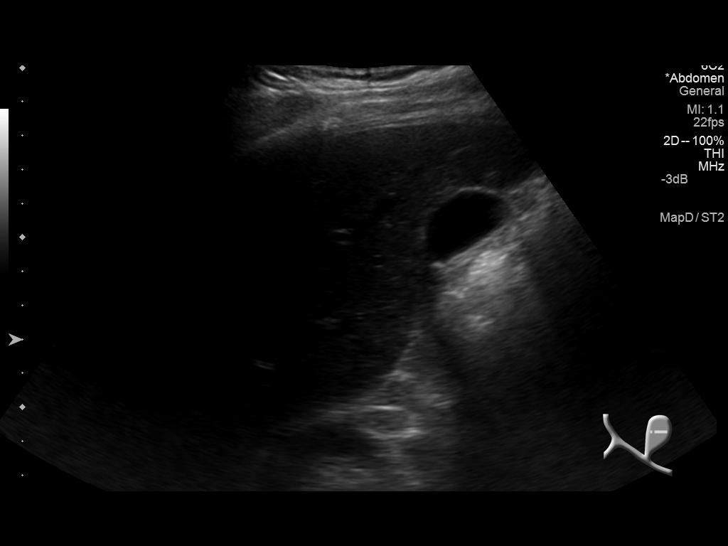
[im 17/99]
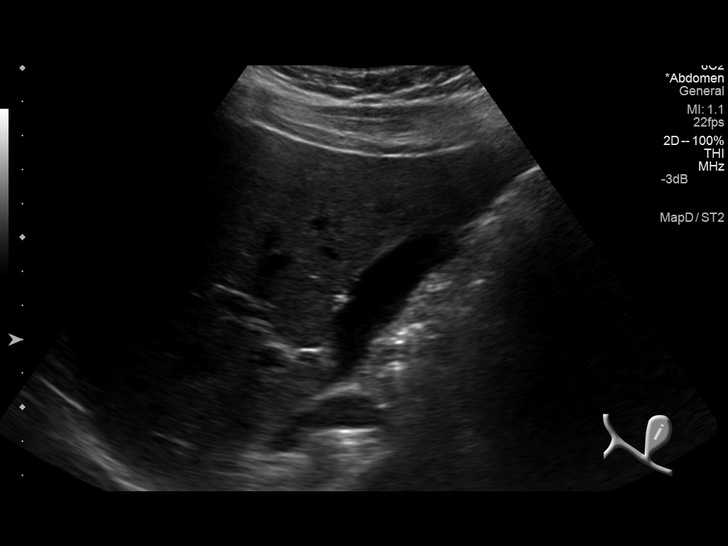
[im 25/99]
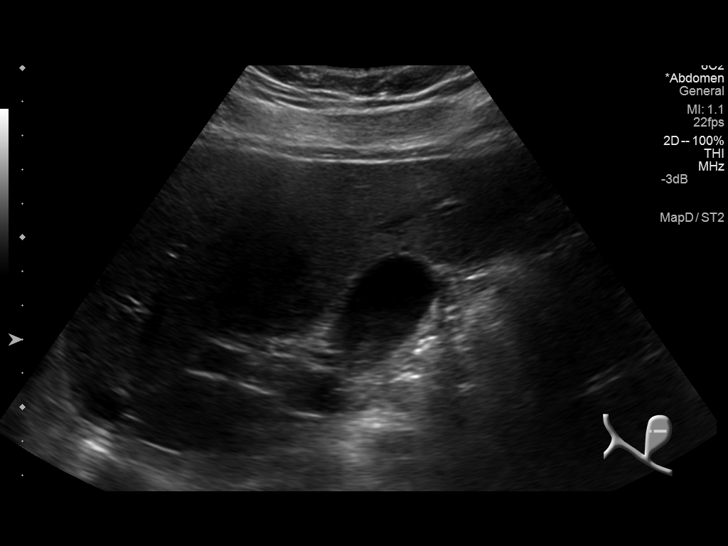
[im 33/99]
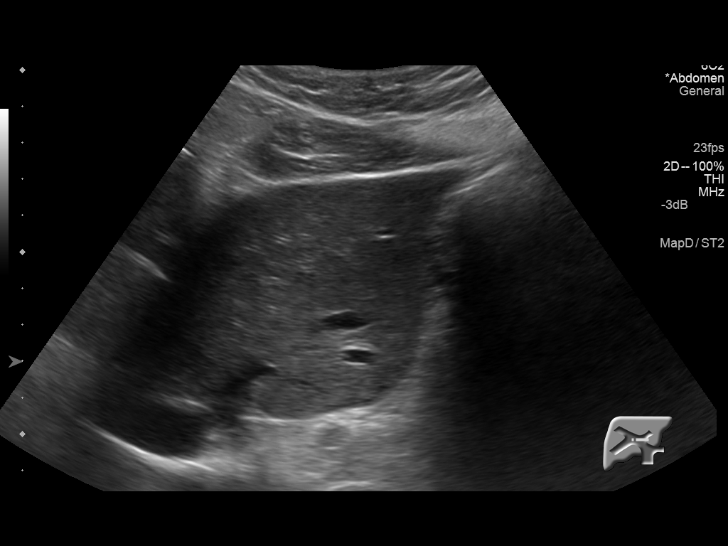
[im 37/99]
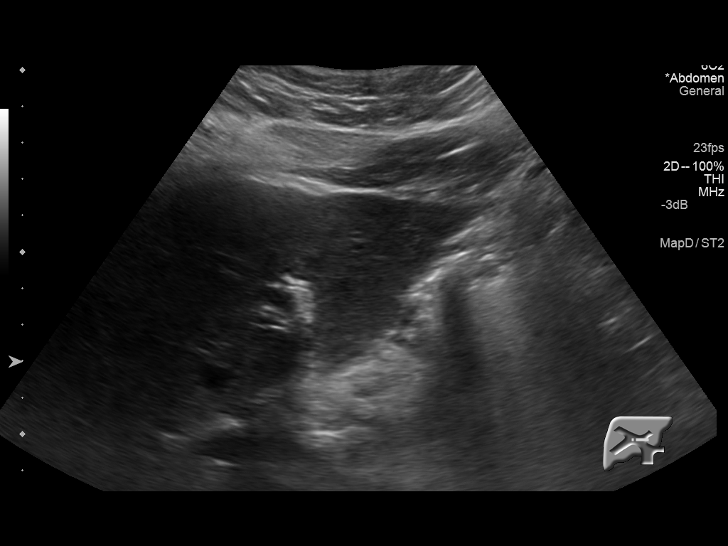
[im 45/99]
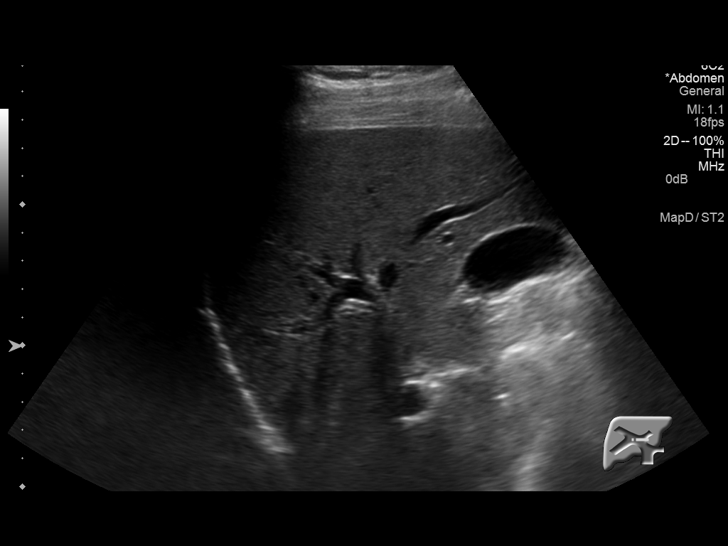
[im 54/99]
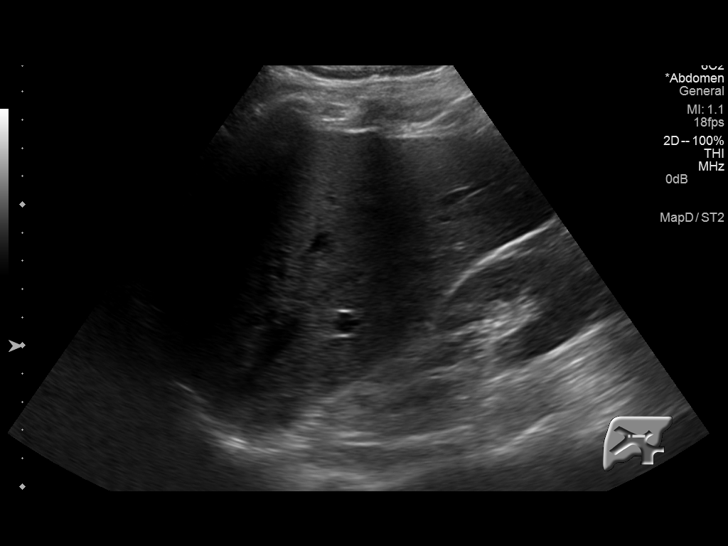
[im 62/99]
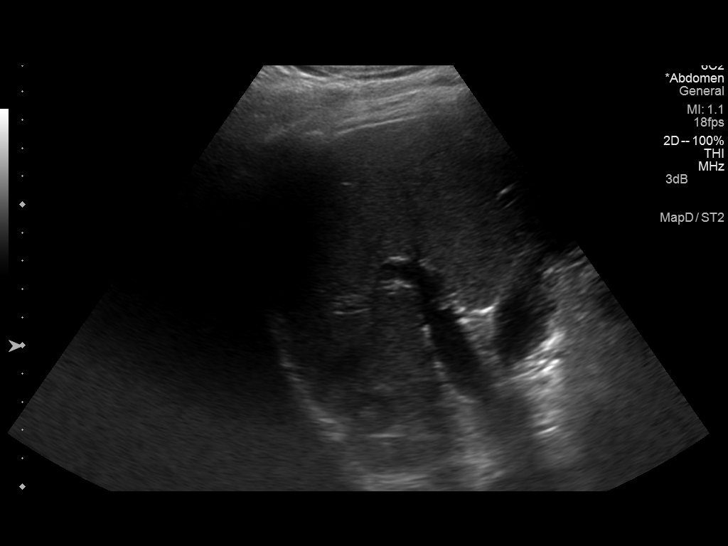
[im 66/99]
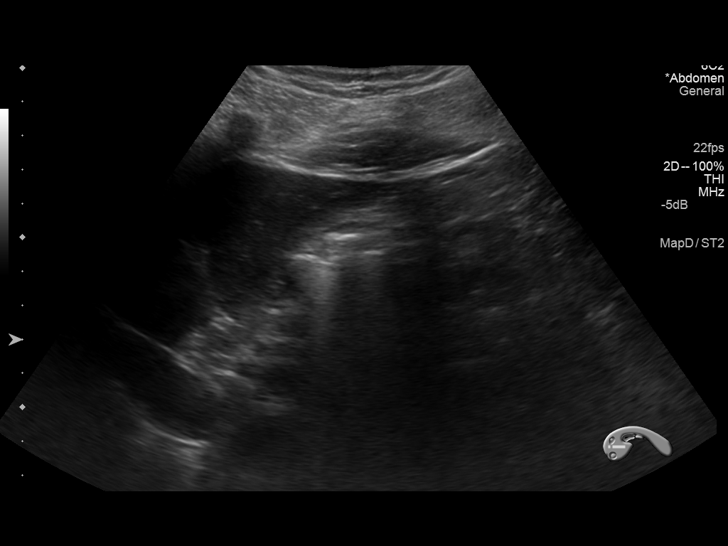
[im 74/99]
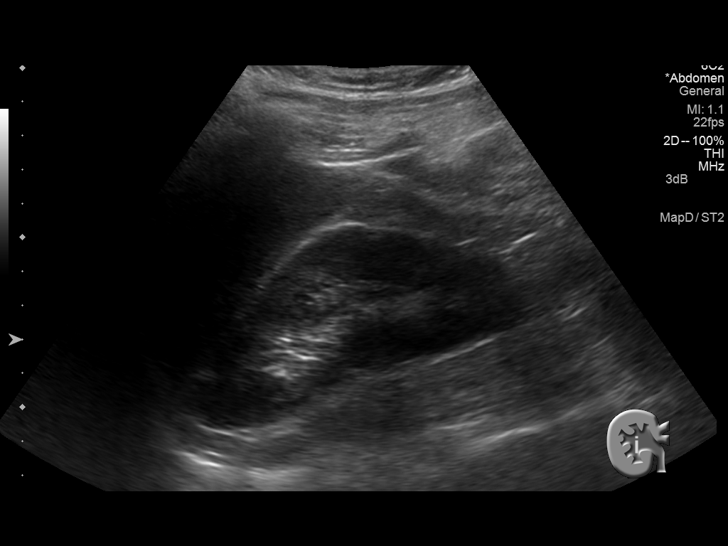
[im 82/99]
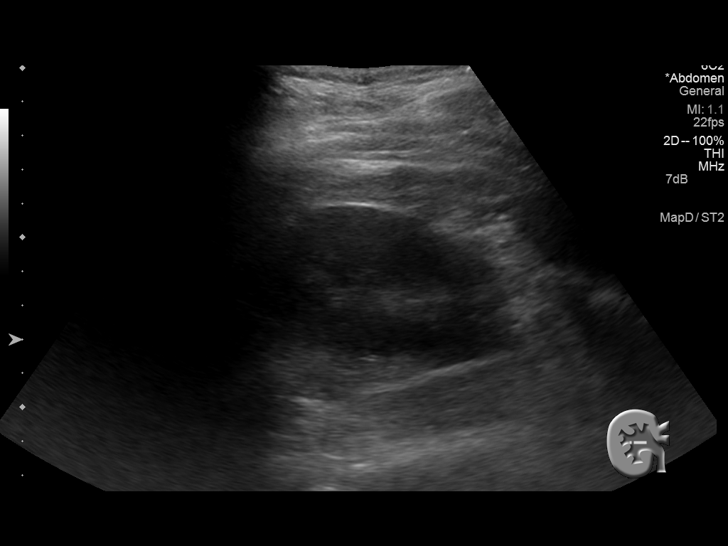
[im 90/99]
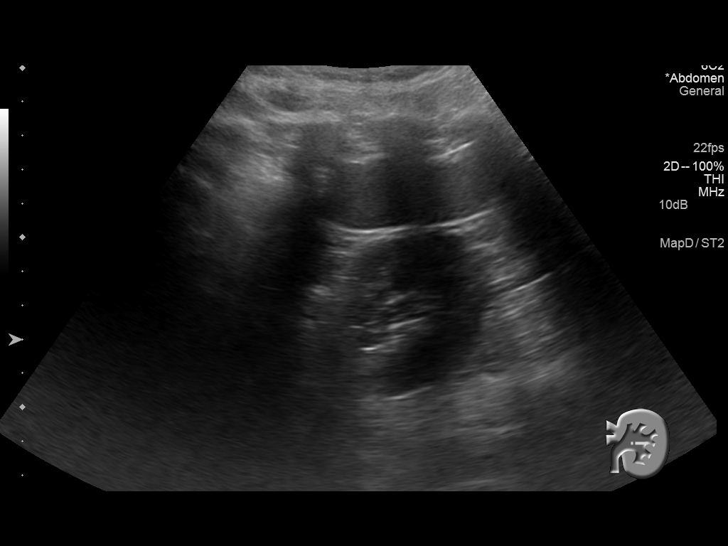
[im 99/99]
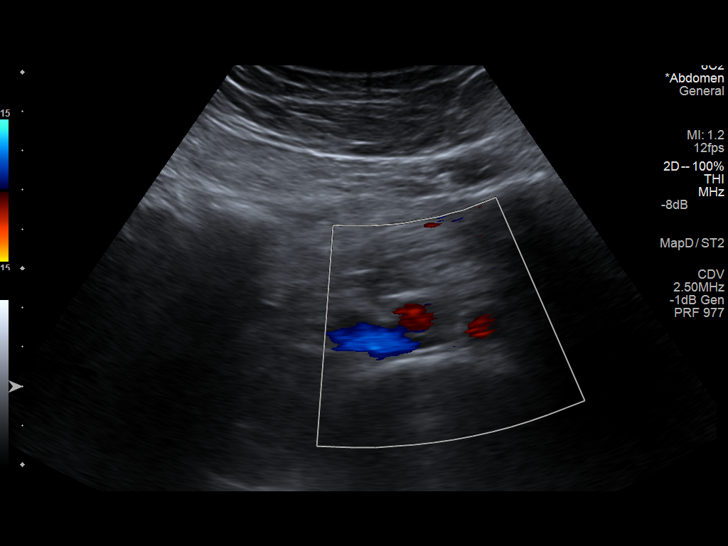

[14 of 25 positions shown; findings below may reference images not displayed]

FINDINGS: Gallbladder: No gallstones or wall thickening visualized. No
sonographic Murphy sign noted.

Common bile duct: Diameter: Upper limits normal at 6.8 mm in
diameter.

Liver: No focal lesion identified. Within normal limits in
parenchymal echogenicity.

IVC: No abnormality visualized.

Pancreas: Visualized portion unremarkable.

Spleen: Size and appearance within normal limits.

Right Kidney: Length: 10.6 cm. Echogenicity within normal limits. No
mass or hydronephrosis visualized.

Left Kidney: Length: 10.8 cm. Echogenicity within normal limits. No
mass or hydronephrosis visualized.

Abdominal aorta: No aneurysm visualized.

Other findings: None.
IMPRESSION: Unremarkable abdominal ultrasound.

## 2015-12-11 MED FILL — SUBOXONE 8 MG-2 MG SL FILM: 8-2 | 7 days supply | Qty: 21 | Fill #0

## 2015-12-18 MED FILL — SUBOXONE 8 MG-2 MG SL FILM: 8-2 | 7 days supply | Qty: 21 | Fill #0

## 2015-12-25 IMAGING — DX DG CERVICAL SPINE COMPLETE 4+V
5 series · 5 of 5 positions shown · non-contrast
Comparison: None.

CLINICAL DATA: Neck and back pain after falling on an IC porch this
morning.

EXAM:
CERVICAL SPINE  4+ VIEWS

[c-spine lat]
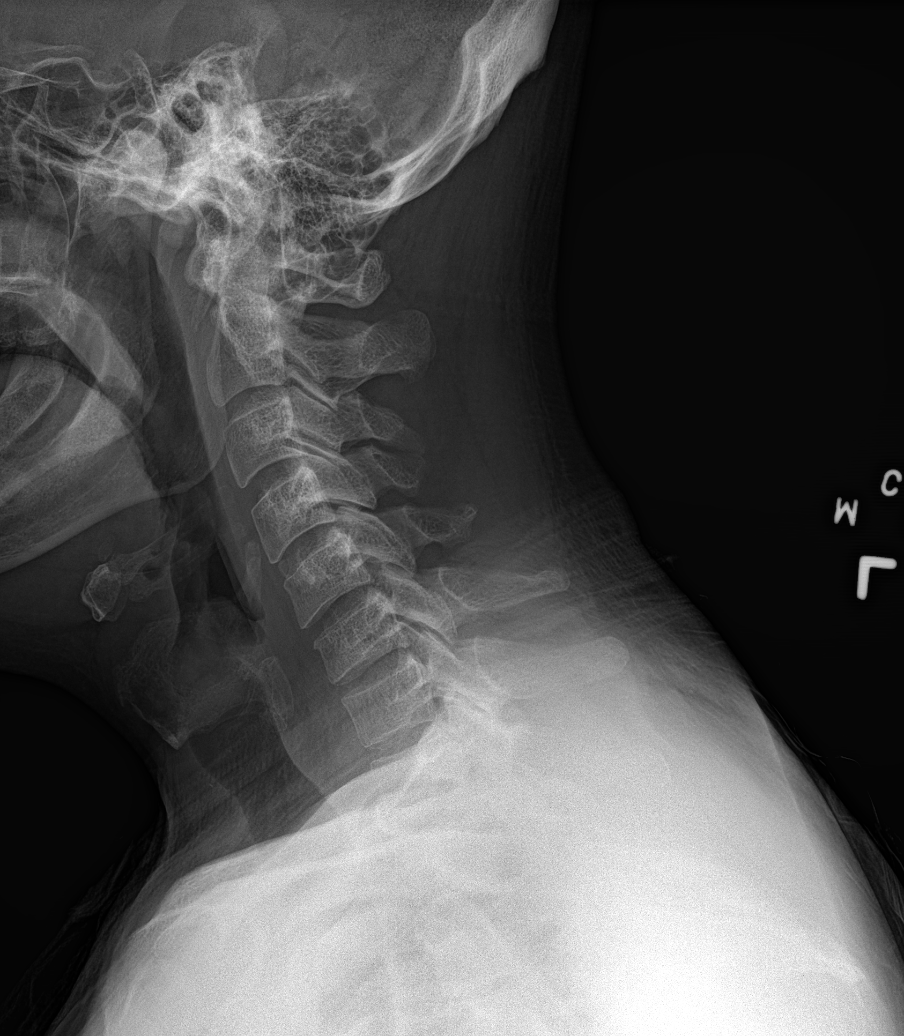

[c-spine obl (1 of 2)]
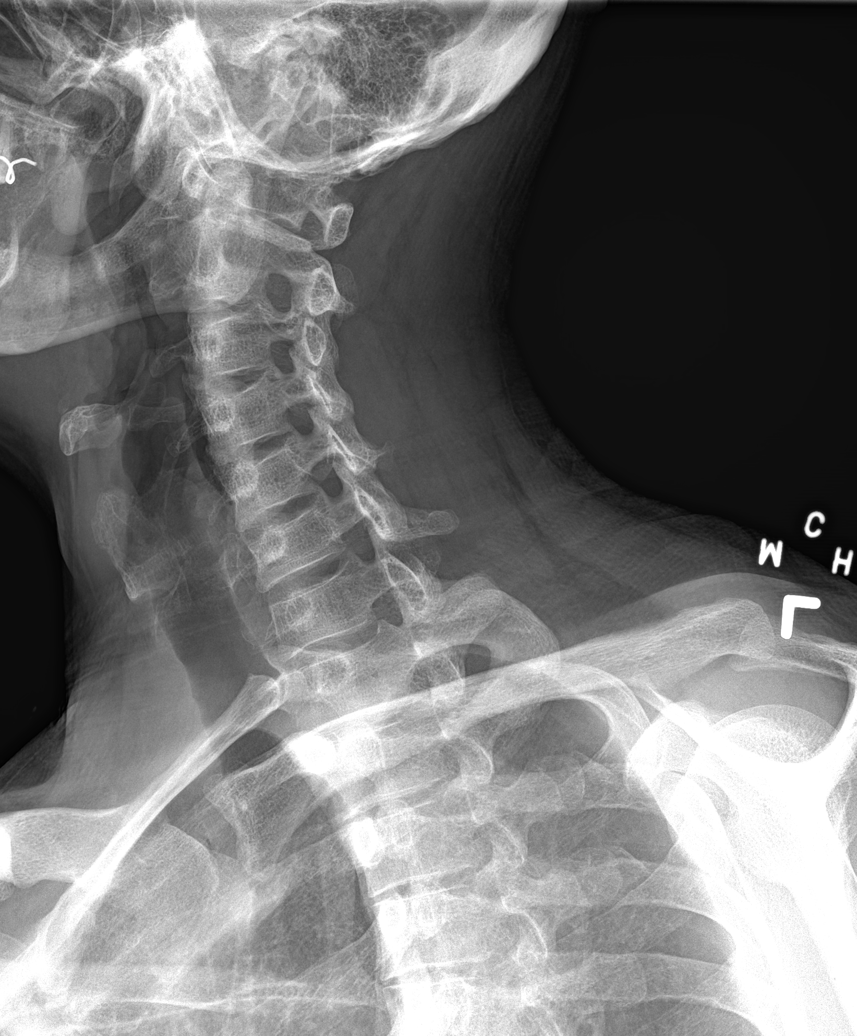

[c-spine obl (2 of 2)]
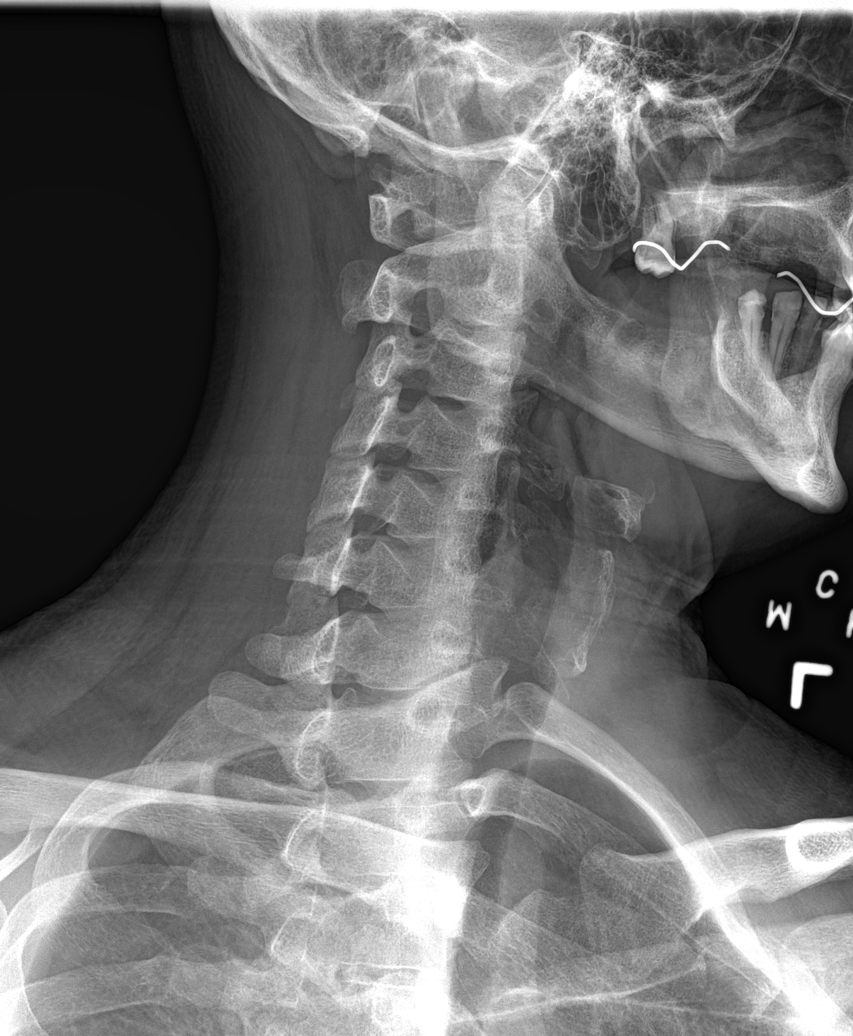

[c-spine ap]
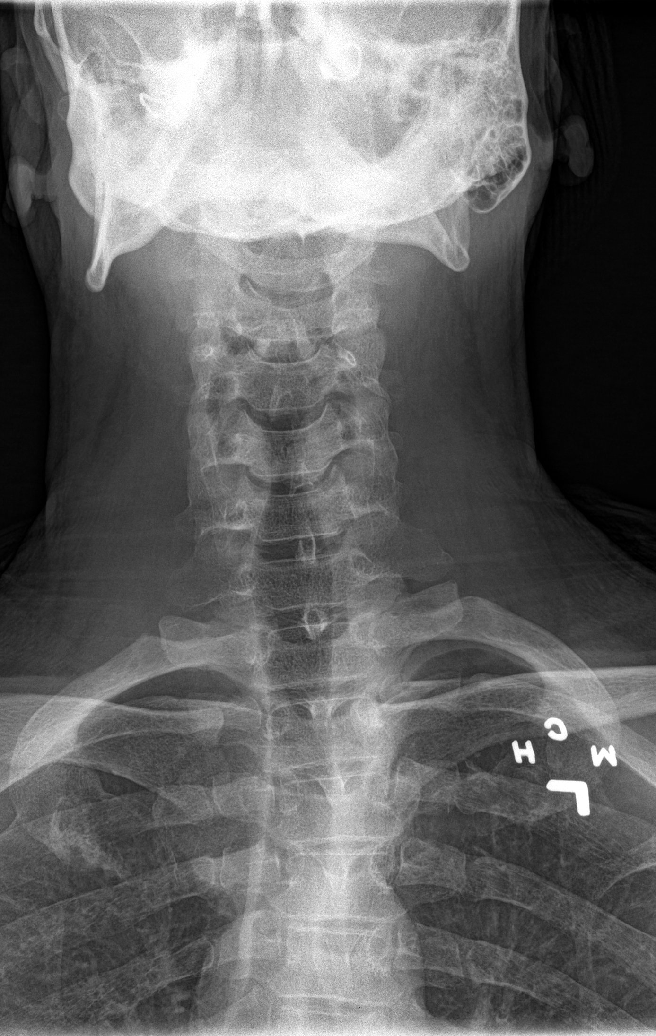

[c-spine open mouth]
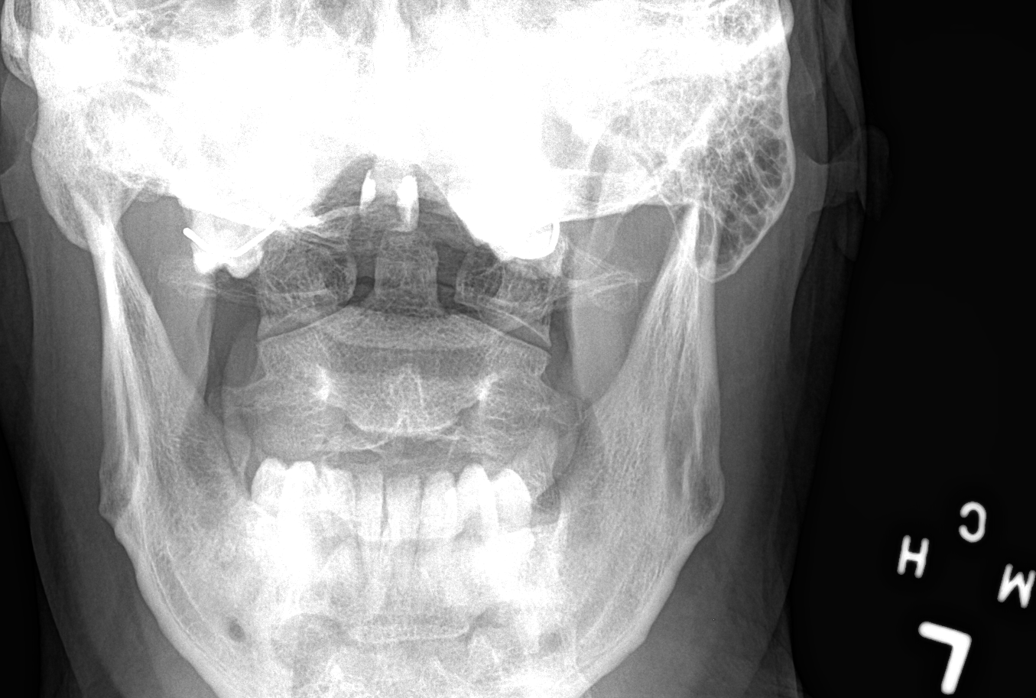

[5 of 5 positions shown; findings below may reference images not displayed]

FINDINGS: There is no evidence of cervical spine fracture. The prevertebral
soft tissues appear unremarkable. There is moderate cervicothoracic
curvature, left convex centered at T2. No significant spondylosis is
evident.
IMPRESSION: Negative for acute fracture

## 2015-12-25 IMAGING — DX DG LUMBAR SPINE COMPLETE 4+V
5 series · 5 of 5 positions shown · non-contrast
Comparison: Lumbar spine radiographs 04/11/2014.

CLINICAL DATA: Neck and back pain after falling today. Initial
encounter.

EXAM:
LUMBAR SPINE - COMPLETE 4+ VIEW

[l-spine ap]
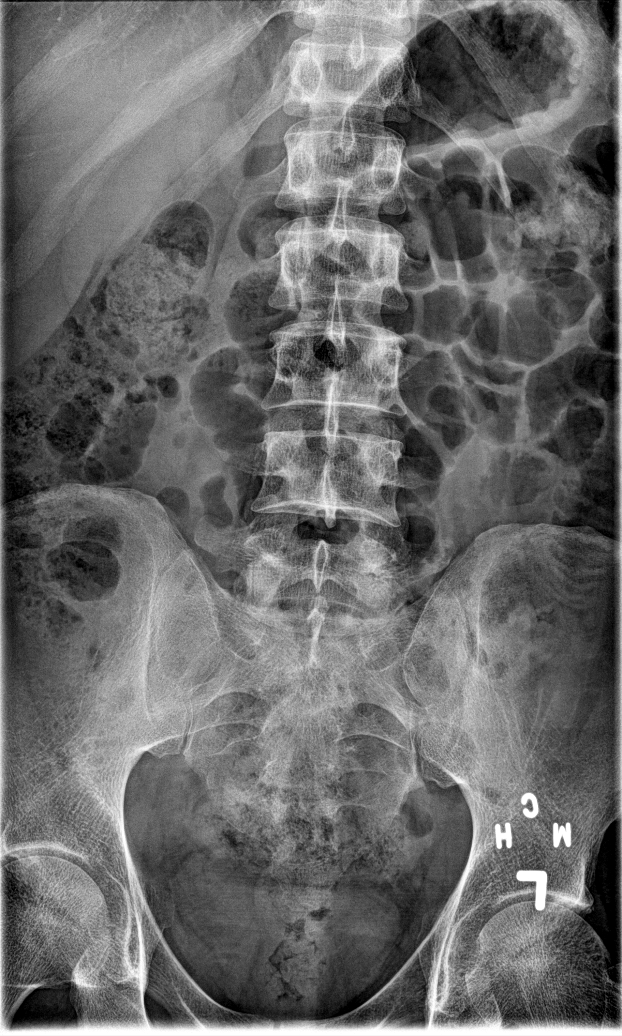

[l-spine obl (1 of 2)]
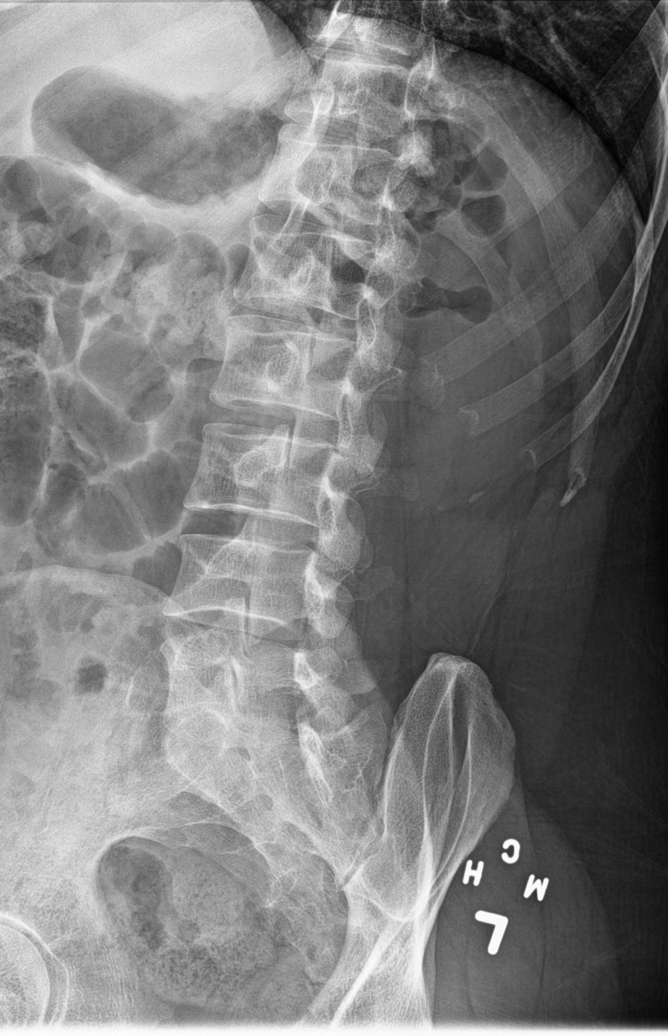

[l-spine obl (2 of 2)]
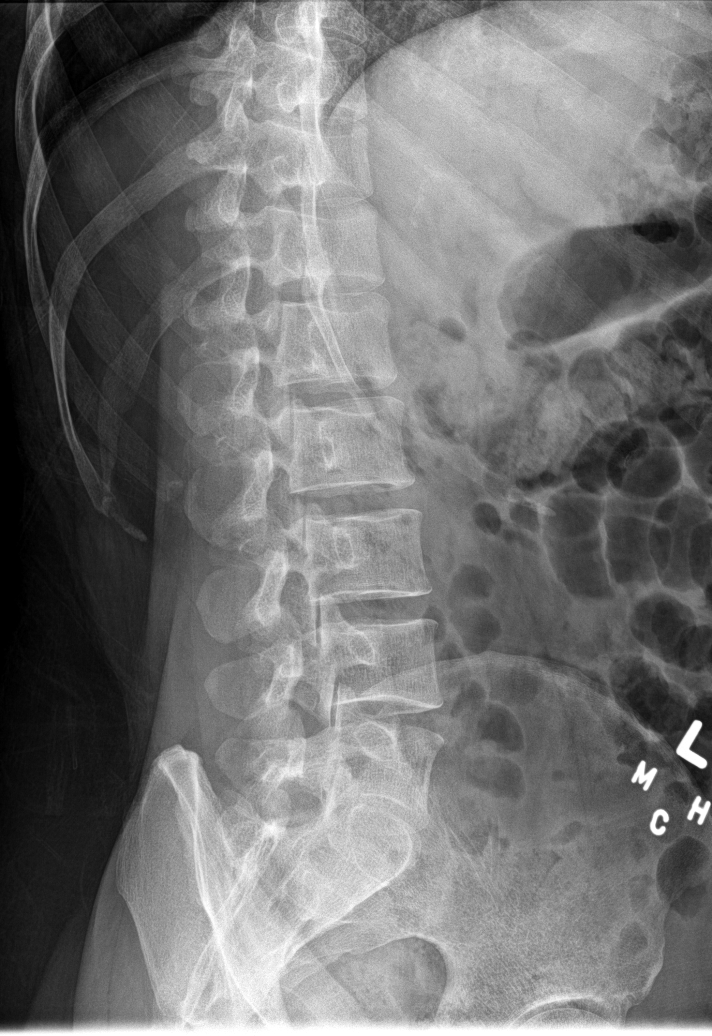

[l-spine lat]
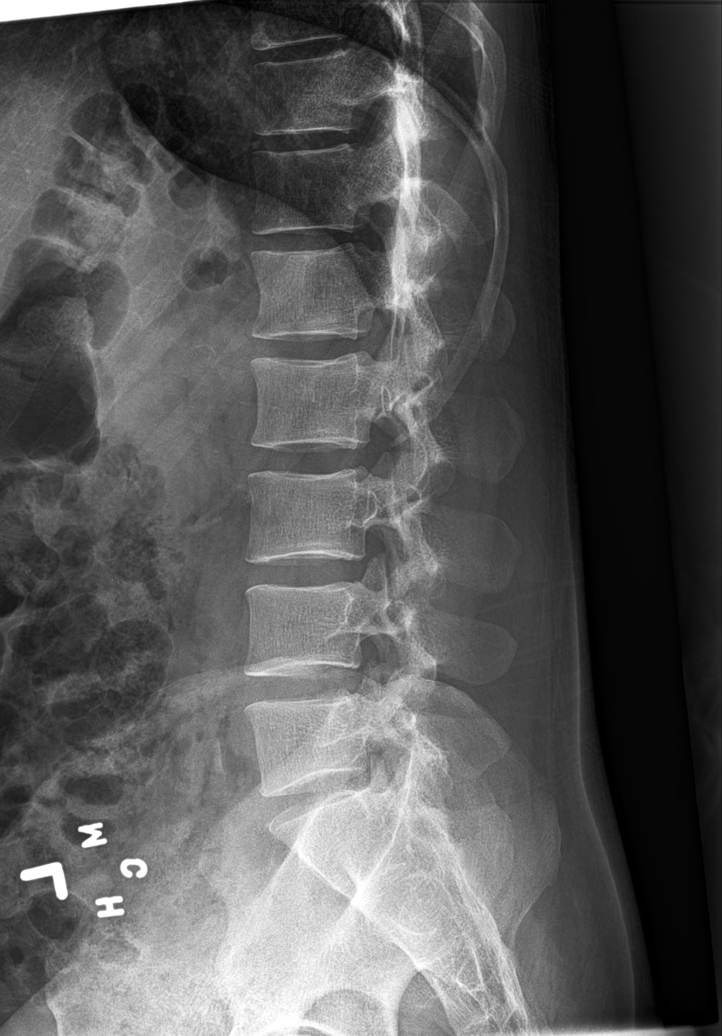

[l-spine spot]
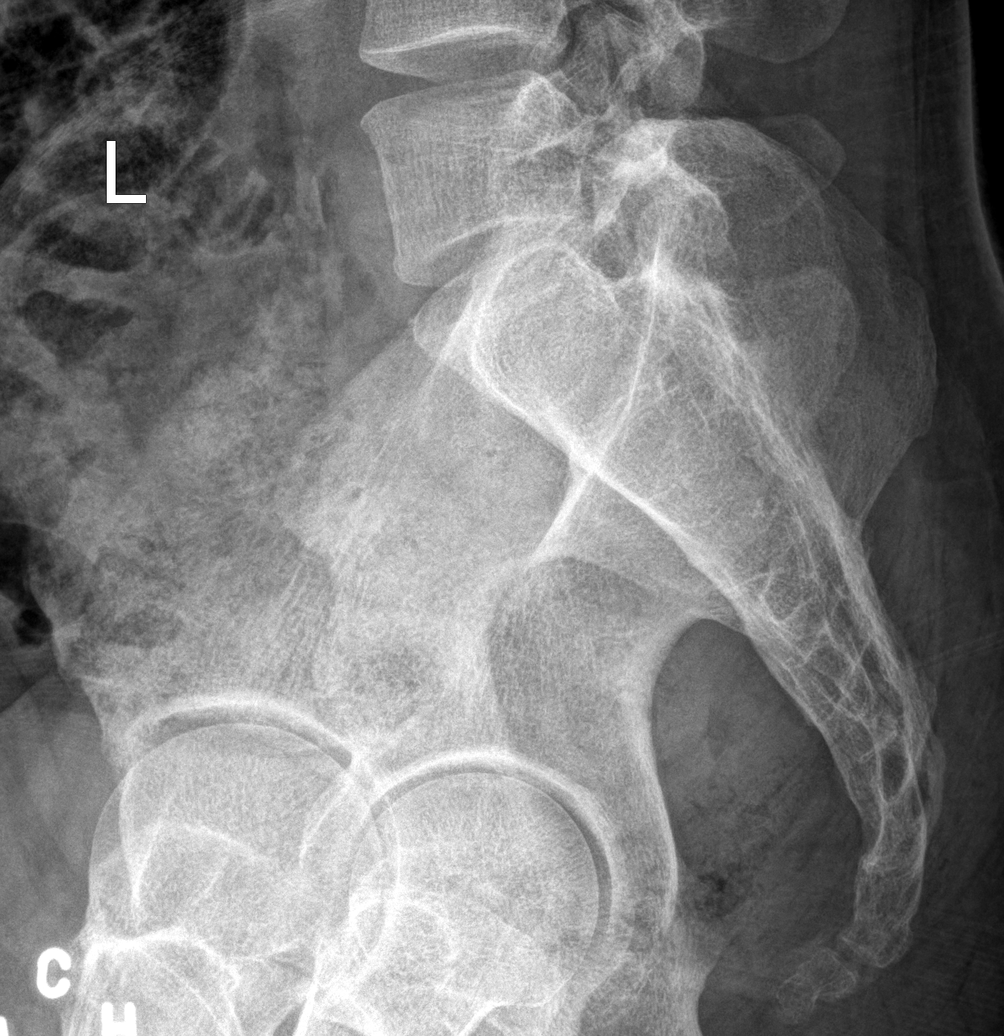

[5 of 5 positions shown; findings below may reference images not displayed]

FINDINGS: There are 5 lumbar type vertebral bodies. The alignment is normal.
The disc spaces are preserved. There is no evidence of acute
fracture or pars defect.
IMPRESSION: Stable unremarkable lumbar spine radiographs. No acute osseous
findings.

## 2015-12-25 MED FILL — SUBOXONE 8 MG-2 MG SL FILM: 8-2 | 14 days supply | Qty: 42 | Fill #0

## 2016-01-08 MED FILL — SUBOXONE 8 MG-2 MG SL FILM: 8-2 | 14 days supply | Qty: 42 | Fill #0

## 2016-03-04 MED FILL — SUBOXONE 8 MG-2 MG SL FILM: 8-2 | 28 days supply | Qty: 84 | Fill #0

## 2016-04-01 MED FILL — SUBOXONE 8 MG-2 MG SL FILM: 8-2 | 14 days supply | Qty: 42 | Fill #0

## 2016-04-15 MED FILL — SUBOXONE 8 MG-2 MG SL FILM: 8-2 | 4 days supply | Qty: 12 | Fill #0

## 2016-04-19 MED FILL — SUBOXONE 8 MG-2 MG SL FILM: 8-2 | 28 days supply | Qty: 84 | Fill #0

## 2016-05-20 MED FILL — SUBOXONE 8 MG-2 MG SL FILM: 8-2 | 21 days supply | Qty: 63 | Fill #0

## 2016-06-08 MED FILL — SUBOXONE 8 MG-2 MG SL FILM: 8-2 | 28 days supply | Qty: 84 | Fill #0

## 2016-07-05 MED FILL — SUBOXONE 8 MG-2 MG SL FILM: 8-2 | 7 days supply | Qty: 21 | Fill #0

## 2016-07-12 MED FILL — SUBOXONE 8 MG-2 MG SL FILM: 8-2 | 28 days supply | Qty: 84 | Fill #0

## 2016-08-09 MED FILL — SUBOXONE 8 MG-2 MG SL FILM: 8-2 | 7 days supply | Qty: 21 | Fill #0

## 2016-08-16 MED FILL — SUBOXONE 8 MG-2 MG SL FILM: 8-2 | 7 days supply | Qty: 21 | Fill #0

## 2020-01-20 DEATH — deceased
